# Patient Record
Sex: Male | Born: 1945 | Race: White | Hispanic: No | Marital: Married | Smoking: Former smoker
Health system: Southern US, Community
[De-identification: ages and names within clinical notes are randomized; demographics above are authoritative.]

## PROBLEM LIST (undated history)

## (undated) DIAGNOSIS — I499 Cardiac arrhythmia, unspecified: Secondary | ICD-10-CM

## (undated) DIAGNOSIS — M109 Gout, unspecified: Secondary | ICD-10-CM

## (undated) DIAGNOSIS — E7800 Pure hypercholesterolemia, unspecified: Secondary | ICD-10-CM

## (undated) DIAGNOSIS — I471 Supraventricular tachycardia: Secondary | ICD-10-CM

## (undated) DIAGNOSIS — R7989 Other specified abnormal findings of blood chemistry: Secondary | ICD-10-CM

## (undated) DIAGNOSIS — E039 Hypothyroidism, unspecified: Secondary | ICD-10-CM

## (undated) DIAGNOSIS — I10 Essential (primary) hypertension: Secondary | ICD-10-CM

## (undated) DIAGNOSIS — E079 Disorder of thyroid, unspecified: Secondary | ICD-10-CM

## (undated) DIAGNOSIS — E785 Hyperlipidemia, unspecified: Secondary | ICD-10-CM

## (undated) DIAGNOSIS — R079 Chest pain, unspecified: Secondary | ICD-10-CM

## (undated) DIAGNOSIS — K219 Gastro-esophageal reflux disease without esophagitis: Secondary | ICD-10-CM

## (undated) DIAGNOSIS — C61 Malignant neoplasm of prostate: Secondary | ICD-10-CM

## (undated) HISTORY — PX: APPENDECTOMY: SHX54

## (undated) HISTORY — DX: Gout, unspecified: M10.9

## (undated) HISTORY — PX: PROSTATECTOMY: SHX69

## (undated) HISTORY — DX: Hyperlipidemia, unspecified: E78.5

## (undated) HISTORY — PX: TONSILLECTOMY: SUR1361

## (undated) HISTORY — DX: Hypothyroidism, unspecified: E03.9

## (undated) HISTORY — DX: Chest pain, unspecified: R07.9

## (undated) HISTORY — DX: Supraventricular tachycardia: I47.1

## (undated) HISTORY — DX: Other specified abnormal findings of blood chemistry: R79.89

---

## 2002-05-22 ENCOUNTER — Encounter: Payer: Self-pay | Admitting: Urology

## 2002-05-27 ENCOUNTER — Inpatient Hospital Stay (HOSPITAL_COMMUNITY): Admission: RE | Admit: 2002-05-27 | Discharge: 2002-05-30 | Payer: Self-pay | Admitting: Urology

## 2002-05-27 ENCOUNTER — Encounter (INDEPENDENT_AMBULATORY_CARE_PROVIDER_SITE_OTHER): Payer: Self-pay | Admitting: Specialist

## 2002-08-03 ENCOUNTER — Ambulatory Visit (HOSPITAL_COMMUNITY): Admission: RE | Admit: 2002-08-03 | Discharge: 2002-08-03 | Payer: Self-pay | Admitting: Gastroenterology

## 2003-07-20 ENCOUNTER — Ambulatory Visit (HOSPITAL_COMMUNITY): Admission: RE | Admit: 2003-07-20 | Discharge: 2003-07-20 | Payer: Self-pay | Admitting: Oncology

## 2004-06-19 ENCOUNTER — Ambulatory Visit: Payer: Self-pay | Admitting: Oncology

## 2004-10-19 ENCOUNTER — Ambulatory Visit: Payer: Self-pay | Admitting: Oncology

## 2005-02-15 ENCOUNTER — Ambulatory Visit: Payer: Self-pay | Admitting: Oncology

## 2005-05-17 ENCOUNTER — Ambulatory Visit: Payer: Self-pay | Admitting: Oncology

## 2005-12-12 ENCOUNTER — Ambulatory Visit: Payer: Self-pay | Admitting: Oncology

## 2005-12-14 LAB — CBC WITH DIFFERENTIAL/PLATELET
BASO%: 0.5 % (ref 0.0–2.0)
Basophils Absolute: 0 10*3/uL (ref 0.0–0.1)
EOS%: 1.9 % (ref 0.0–7.0)
Eosinophils Absolute: 0.1 10*3/uL (ref 0.0–0.5)
HCT: 40.2 % (ref 38.7–49.9)
HGB: 14.1 g/dL (ref 13.0–17.1)
LYMPH%: 30.5 % (ref 14.0–48.0)
MCH: 31.6 pg (ref 28.0–33.4)
MCHC: 35.2 g/dL (ref 32.0–35.9)
MCV: 89.9 fL (ref 81.6–98.0)
MONO#: 0.4 10*3/uL (ref 0.1–0.9)
MONO%: 9.6 % (ref 0.0–13.0)
NEUT#: 2.5 10*3/uL (ref 1.5–6.5)
NEUT%: 57.5 % (ref 40.0–75.0)
Platelets: 128 10*3/uL — ABNORMAL LOW (ref 145–400)
RBC: 4.47 10*6/uL (ref 4.20–5.71)
RDW: 12.9 % (ref 11.2–14.6)
WBC: 4.3 10*3/uL (ref 4.0–10.0)
lymph#: 1.3 10*3/uL (ref 0.9–3.3)

## 2006-06-12 ENCOUNTER — Ambulatory Visit: Payer: Self-pay | Admitting: Oncology

## 2006-07-09 ENCOUNTER — Emergency Department (HOSPITAL_COMMUNITY): Admission: EM | Admit: 2006-07-09 | Discharge: 2006-07-09 | Payer: Self-pay | Admitting: Emergency Medicine

## 2007-03-04 ENCOUNTER — Ambulatory Visit: Payer: Self-pay | Admitting: Oncology

## 2007-03-21 LAB — COMPREHENSIVE METABOLIC PANEL
ALT: 28 U/L (ref 0–53)
AST: 22 U/L (ref 0–37)
Alkaline Phosphatase: 62 U/L (ref 39–117)
CO2: 27 mEq/L (ref 19–32)
Sodium: 141 mEq/L (ref 135–145)
Total Bilirubin: 0.8 mg/dL (ref 0.3–1.2)
Total Protein: 6.6 g/dL (ref 6.0–8.3)

## 2007-03-21 LAB — CBC WITH DIFFERENTIAL/PLATELET
BASO%: 0.4 % (ref 0.0–2.0)
EOS%: 2.1 % (ref 0.0–7.0)
LYMPH%: 36.1 % (ref 14.0–48.0)
MCHC: 36 g/dL — ABNORMAL HIGH (ref 32.0–35.9)
MONO#: 0.3 10*3/uL (ref 0.1–0.9)
MONO%: 8.5 % (ref 0.0–13.0)
Platelets: 137 10*3/uL — ABNORMAL LOW (ref 145–400)
RBC: 4.58 10*6/uL (ref 4.20–5.71)
WBC: 2.9 10*3/uL — ABNORMAL LOW (ref 4.0–10.0)

## 2010-06-06 ENCOUNTER — Encounter: Admission: RE | Admit: 2010-06-06 | Discharge: 2010-06-06 | Payer: Self-pay | Admitting: Allergy and Immunology

## 2010-06-06 IMAGING — CR DG CHEST 2V
2 series · 2 of 2 positions shown · non-contrast
Comparison: None.

CLINICAL DATA: Chest and left rib pain.

CHEST - 2 VIEW

[w chest pa]
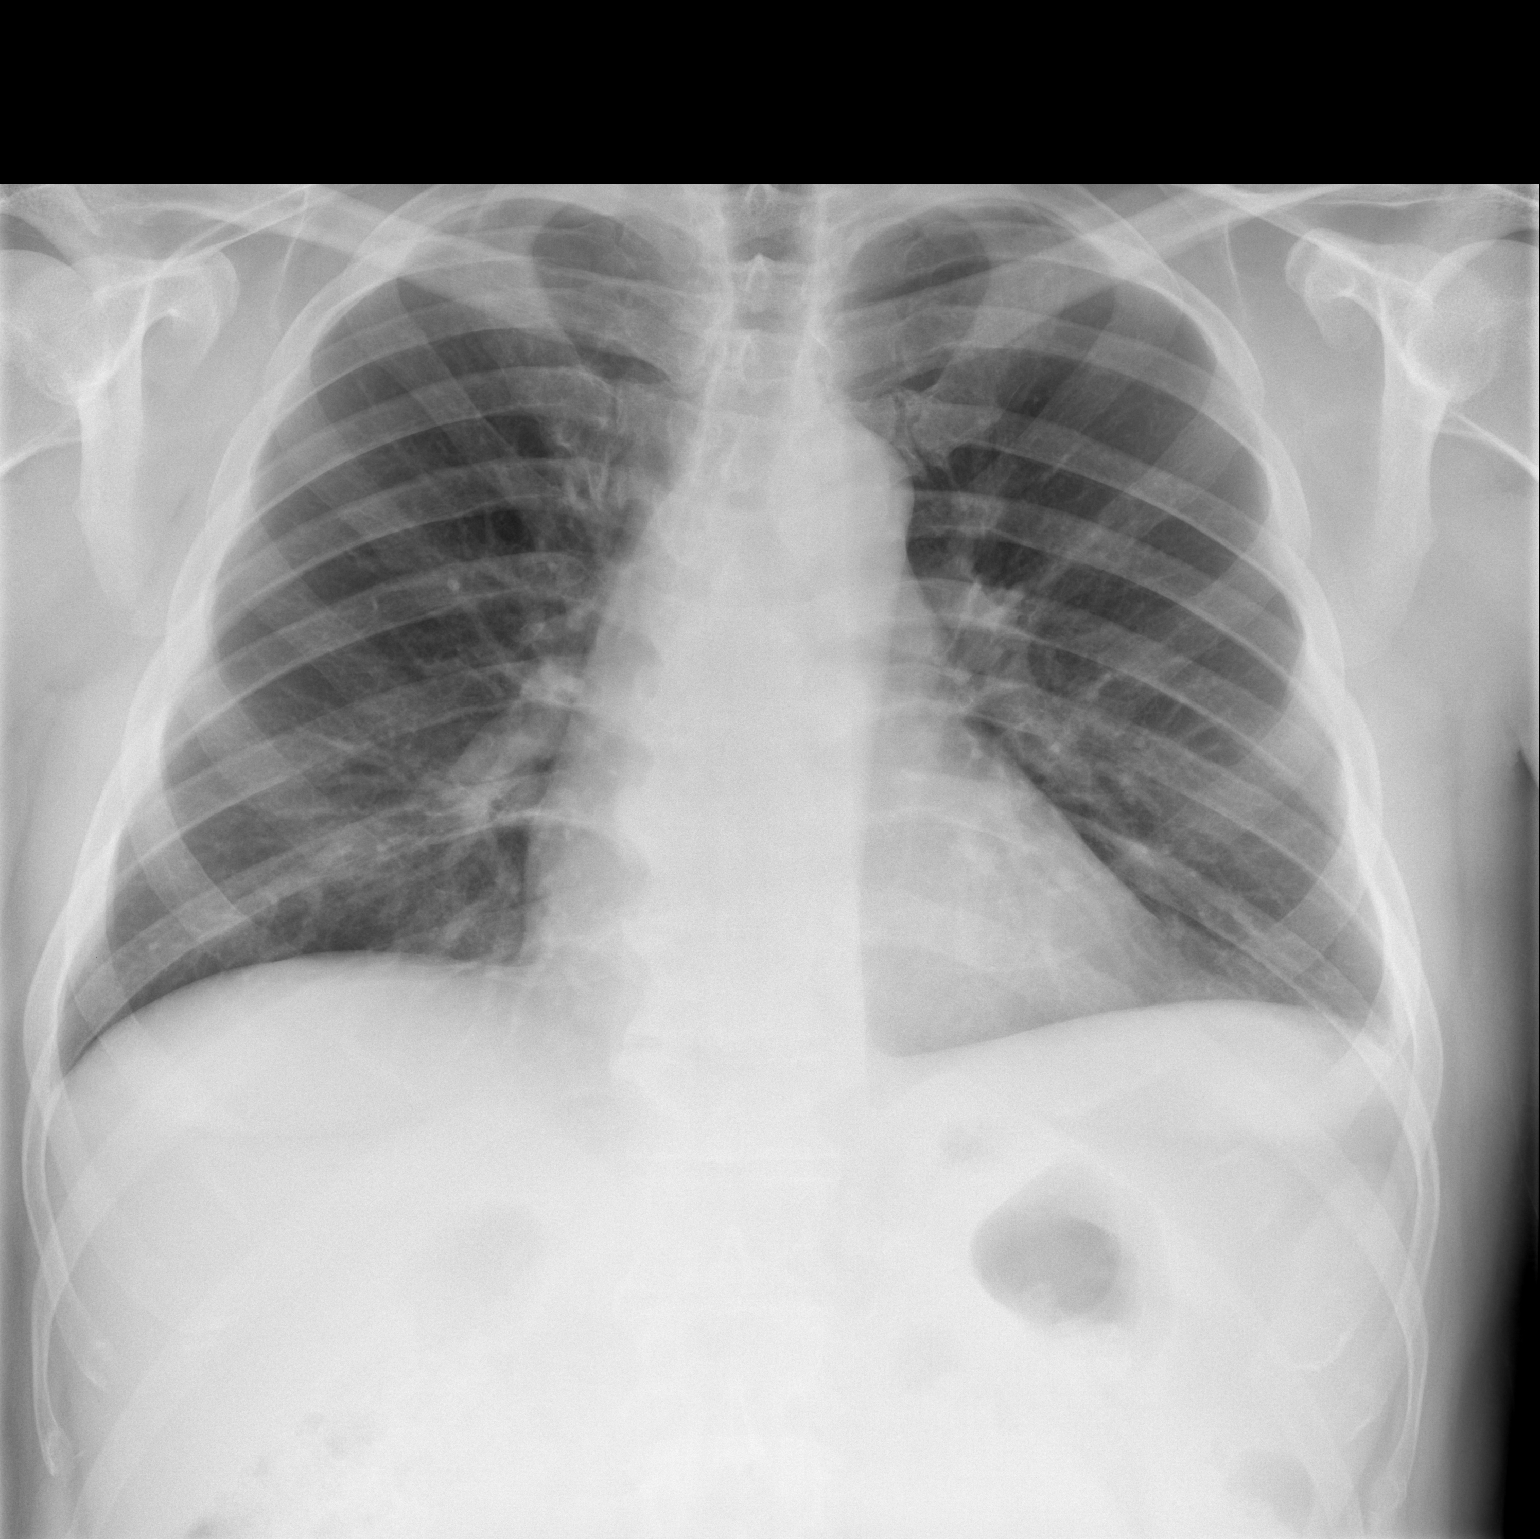

[w chest lat]
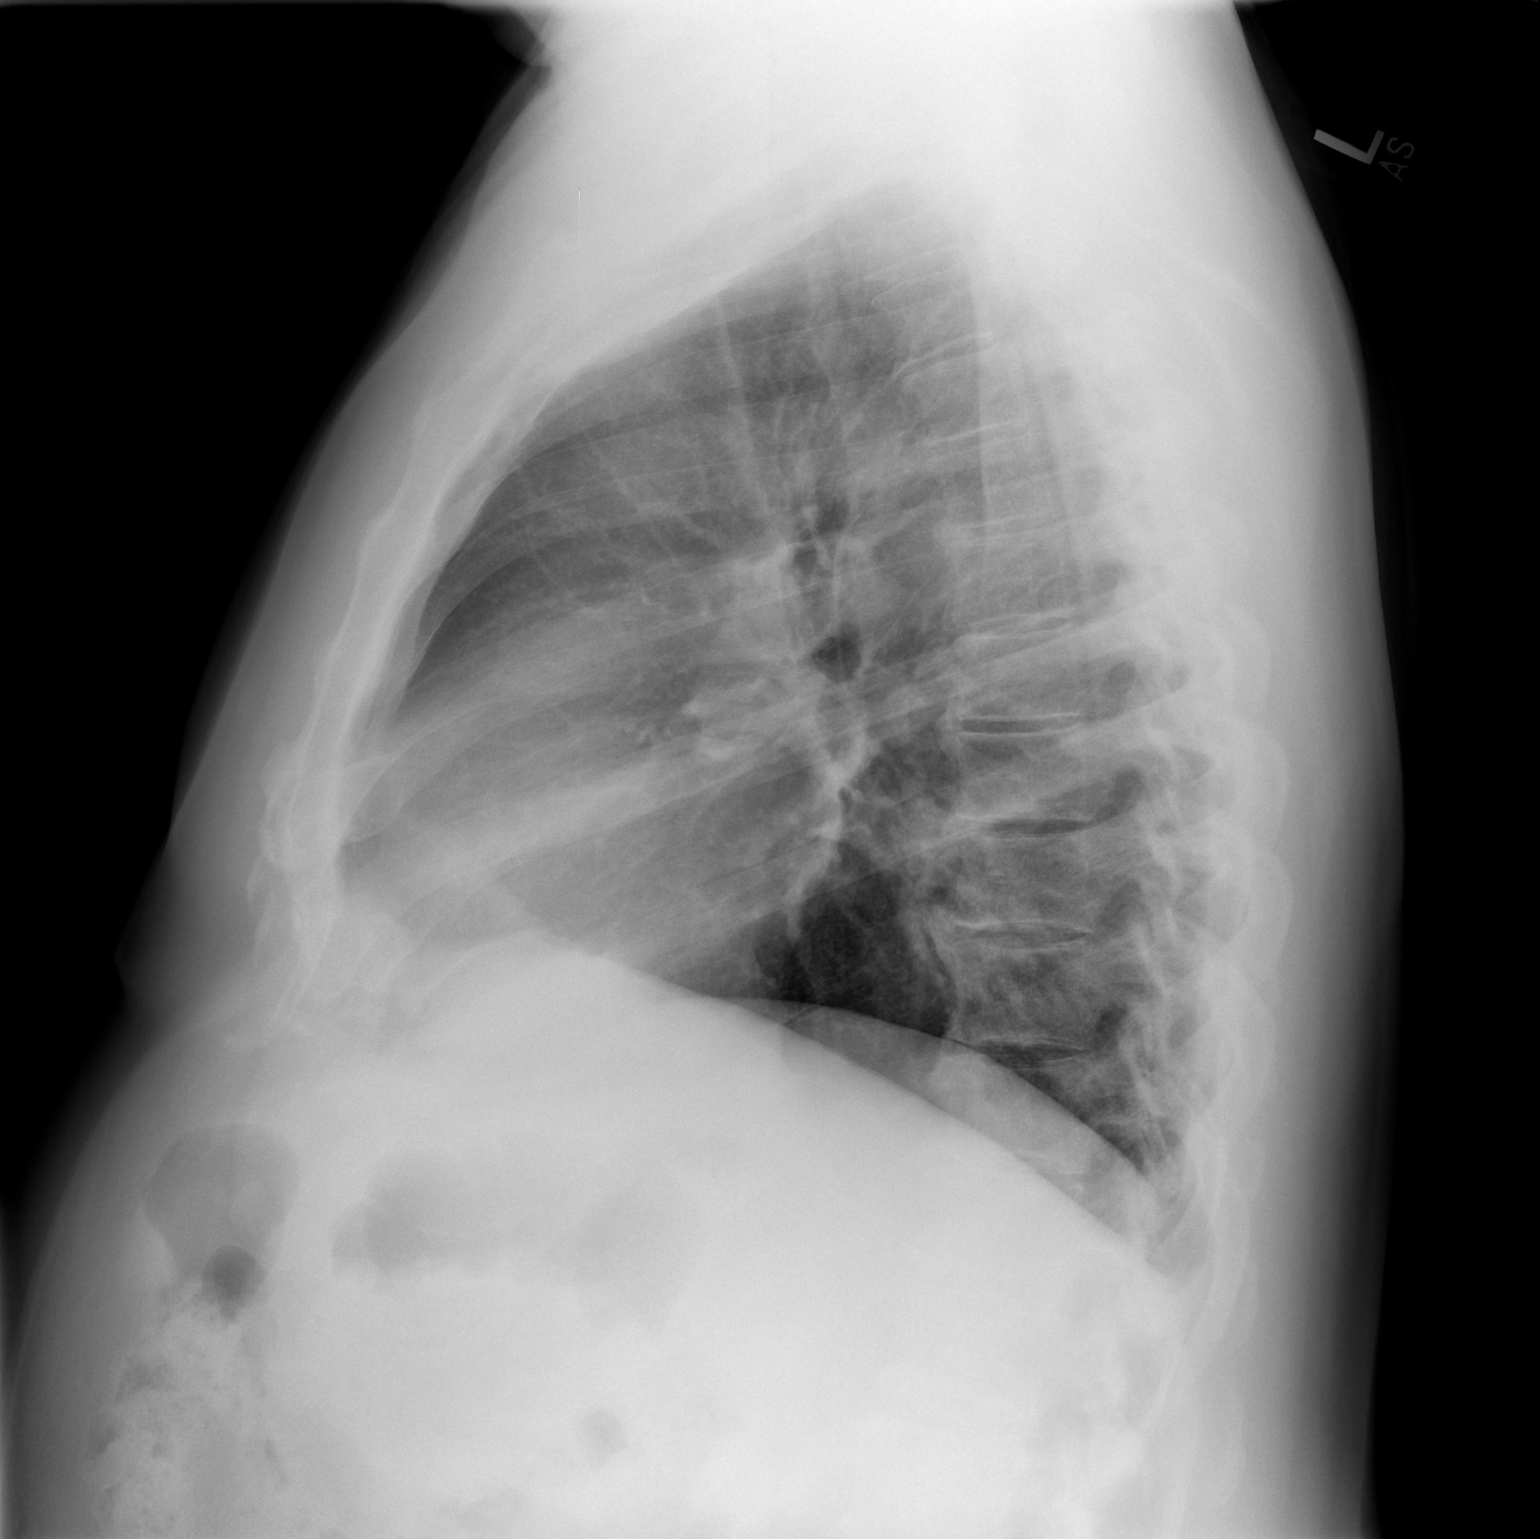

[2 of 2 positions shown; findings below may reference images not displayed]

FINDINGS: The lungs are clear bilaterally.  No confluent airspace
opacities, pleural effuions or pneumothoracies are seen.  The heart
is normal in size and contour.  The upper abdomen and osseous
structures are normal. No displaced rib fractures are seen.
IMPRESSION: No acute cardiopulmonary disease.

## 2010-12-08 NOTE — Op Note (Signed)
NAME:  Jorge Morrison, Jorge Morrison                      ACCOUNT NO.:  [id number]   MEDICAL RECORD NO.:  [id number]                   PATIENT TYPE:  INP   LOCATION:  NA                                   FACILITY:  [facility]   PHYSICIAN:  Maretta Bees. Vonita Moss, M.D.             DATE OF BIRTH:  [birth year]   DATE OF PROCEDURE:  05/27/2002  DATE OF DISCHARGE:                                 OPERATIVE REPORT   PREOPERATIVE DIAGNOSIS:  Carcinoma of the prostate.   POSTOPERATIVE DIAGNOSIS:  Carcinoma of the prostate.   PROCEDURE:  Radical retropubic prostatectomy and bilateral pelvic lymph node  dissection.   SURGEON:  Maretta Bees. Vonita Moss, M.D.   ASSISTANTS:  Lucrezia Starch. Earlene Plater, M.D., and Melvyn Novas, M.D.   ANESTHESIA:  General.   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  He and his wife were counseled about therapeutic options, and he selected  radical prostatectomy.   DESCRIPTION OF PROCEDURE:  The patient was brought to the operating room and  placed in the supine position.  The lower abdomen and external genitalia  were prepped and draped in the usual fashion.  A #4 French 30 cc Foley was  inserted into the bladder without difficulty.  A lower midline vertical  incision was made and the rectus fascia and muscle divided in the midline,  and the retroperitoneal pelvic space was dissected out.  The obturator lymph  node packet on the right side was dissected out without difficulty with care  to preserve the major vessels and the obturator nerve, and hemostasis and  lymphostasis was obtained with Hem-o-loc clips and black silk ties.  The  endopelvic fascia was taken down bilaterally.  He had a lot of large dorsal  veins cascading over the top  of the prostate.  Some back bleeding was  controlled with 2-0 Vicryl suture ligatures.  The puboprostatic ligaments  were taken down, and a Hohenfellner clamp was placed around the dorsal vein  complex and a heavy Vicryl tie placed down, and the dorsal vein complex was  divided, but it was quite thickened and heavy and required some figure-of-  eight sutures of 2-0 Vicryl to control venous bleeding.  The apex of the  prostate was divided with a nice margin at the urethra and the posterior  urethra elevated with an umbilical tape and divided, and the prostate came  up nicely off the rectum.  The prostate was well-seated in the pelvis, and  care was taken to push away the neurovascular bundles.  The prostatic  pedicles were taken down bilaterally with the use of Hem-l-loc clips.  Posterior aspect of seminal vesicles were exposed  and isolated.  The  anterior bladder neck was then divided and indigo carmine-stained urine was  noted to excrete from the ureteral orifices well away from the bladder neck.  The posterior bladder neck was opened and the plane between the prostate and  posterior bladder neck was created, and the seminal vesicles and ampulla of  each vas were divided and clipped with hemoclips.  During the course of this  procedure there was no serious bleeder, but he had a lot of small vessels  that caused blood loss during the course of the procedure, and hemostasis  was performed with a combination of electrocautery, hemoclips, and suture  ligatures of 2-0 chromic catgut.  His vital signs remained stable in view of  an estimated blood loss of 2500 cc.  He was given two units of packed cells  in the course of the procedure.  At this point with bleeding now under good  control, the bladder neck mucosa was approximated to the bladder neck  musculature with 4-0 chromic catgut using interrupted sutures.  The  posterior bladder neck was closed in tennis racquet fashion using running 2-  0  chromic catgut.  This created a nice bladder neck with the mucosa everted.  Vicryl 2-0 sutures on UR5 needles were then used to complete our bladder  neck-urethral anastomosis by placing sutures at 2, 5, 7, 10, and 12 o'clock,  and the bladder was drained with a 22 [place], 5 cc Foley with 15 cc water  placed in the balloon after it was placed in the bladder and after a #1  Prolene suture had been tied through the tip of the Foley catheter and  brought up through the anterior bladder wall into the abdominal wall and  later sewn at skin level using a button to secure it.  Through a stab wound  to the left of the incision, a large Blake drain was placed to drain the  prevesical space.  After tying down the bladder neck anastomosis, the  bladder was irrigated with antibiotic solution and there was no clots or  bleeding of any concern whatsoever.  The wound was irrigated and the fascia  closed with running #1 PDS.  The subcu was irrigated and the skin closed  with skin staples.  The Blake drain was connected to bulb suction after  having been sewn in place with a heavy black silk.  The wound was cleaned,  dressed with dry sterile gauze dressings, the Foley catheter was placed on  traction, and he was taken to the recovery room in good condition, having  tolerated the procedure well.                                               Maretta Bees. Vonita Moss, M.D.    LJP/MEDQ  D:  05/27/2002  T:  05/27/2002  Job:  [id number]   cc:   Samul Dada, M.D.  [address] [facility]  [place]  [place] [address]  Fax: [phone]

## 2010-12-08 NOTE — Discharge Summary (Signed)
   NAME:  Jorge Morrison, Jorge Morrison                      ACCOUNT NO.:  [id number]   MEDICAL RECORD NO.:  [id number]                   PATIENT TYPE:  INP   LOCATION:  0382                                 FACILITY:  [facility]   PHYSICIAN:  Maretta Bees. Vonita Moss, M.D.             DATE OF BIRTH:  08/22/1945   DATE OF ADMISSION:  05/27/2002  DATE OF DISCHARGE:  05/30/2002                                 DISCHARGE SUMMARY   FINAL DIAGNOSES:  1. Prostatic carcinoma.  2. Hypertension.  3. Thrombocytopenia.  4. Gastroesophageal reflux disease.   PROCEDURE:  Radical retropubic prostatectomy and bilateral pelvic lymph node  dissection on May 27, 2002.   HISTORY OF PRESENT ILLNESS:  This 65 year old white male had a PSA that rose  to 5.21 and subsequently 5.69 and had a vague tiny nodule in the lateral  aspect of the right lobe of the prostate, and he had Gleason grade 7 on  biopsy in the right lobe and Gleason grade 6 in the left lobe with some  perineural invasion.  He was counseled preoperatively about therapies and  cleared by Dr. Arline Asp for some thrombocytopenia without any significant  bleeding disorders.   HOSPITAL COURSE:  After admission the patient underwent radical retropubic  prostatectomy and bilateral pelvic lymph node dissection.  He did require  intraoperative transfusion.  His hemoglobin stabilized in the range of 10  postoperatively.  His postoperative course was benign.  He did have some  hypokalemia, requiring some IV calcium.  His postoperative course was  otherwise smooth and benign, and his Harrison Mons drain was removed on the day of  discharge.  He was sent home with a Foley catheter connected to a leg bag.   DISCHARGE MEDICATIONS:  Tylox for pain, in addition to his usual  medications, including Doxazosin, Niaspan, hydrochlorothiazide, Allegra, and  multivitamins.   CONDITION ON DISCHARGE:  He was sent home in good condition.   DIET:  He will resume diet as tolerated.   ACTIVITY:  He will go home on limited activity.   FOLLOW-UP:  He will return to the office in one week in follow-up.   PATHOLOGY:  His final pathology showed no tumor in lymph nodes, but a  Gleason grade 7 carcinoma with tumor to the surgical margin, and no tumor in  the seminal vesicles.                                                Maretta Bees. Vonita Moss, M.D.    LJP/MEDQ  D:  06/08/2002  T:  06/08/2002  Job:  [id number]

## 2010-12-08 NOTE — H&P (Signed)
   NAME:  Jorge Morrison, Jorge Morrison                      ACCOUNT NO.:  [id number]   MEDICAL RECORD NO.:  [id number]                   PATIENT TYPE:  INP   LOCATION:  NA                                   FACILITY:  [facility]   PHYSICIAN:  Maretta Bees. Vonita Moss, M.D.             DATE OF BIRTH:  10-04-1945   DATE OF ADMISSION:  DATE OF DISCHARGE:                                HISTORY & PHYSICAL   HISTORY OF PRESENT ILLNESS:  The patient is a 65 year old white male who was  sent to me because his PSA had risen from 3.5 in 2002 to 5.21 in June of  2003 and then to 5.69 in July of this year. He had a vague tiny nodule in  the lateral aspect of the right lobe and underwent a biopsy of the prostate  that showed Gleason grade 7 in the right lobe and Gleason grade 6 in the  left lobe. There was some perineural invasion noted. He and his wife were  informed about various therapies including observation, radicle  prostatectomy, radiotherapy and of less utilized techniques. He opted for  surgery and is scheduled at this date. He has a history of low platelets  counts that have not been a clinical problem and was cleared for surgery by  Dr. Arline Asp in that regard.   PAST MEDICAL HISTORY:  Includes hypertension and hypercholesterolemia. He  occasionally has some gastroesophageal reflux disease.   PAST SURGICAL HISTORY:  Include an appendectomy and a tonsillectomy and  adenoidectomy. He has also had a valvectomy.   SOCIAL HISTORY:  He does not smoke. He drinks occasional alcohol.   FAMILY HISTORY:  As noted on the health history form.   REVIEW OF SYSTEMS:  As noted on the health history form.   PHYSICAL EXAMINATION:  VITAL SIGNS: Blood pressure is 120/88, pulse 62,  temperature 97.  GENERAL: Alert and oriented. In no acute distress.  SKIN: Warm and dry.  NECK: Supple.  LUNGS:  No respiratory distress.  ABDOMEN: Soft and nontender.  GU: External genitalia normal. Prostate examination unremarkable.   IMPRESSION:  1. Prostatic carcinoma.  2. Hypertension.  3.     Thrombocytopenia.  4. Gastroesophageal reflux disease.   PLAN:  Radical retropubic prostatectomy and bilateral pelvic lymph node  dissection.                                                Maretta Bees. Vonita Moss, M.D.    LJP/MEDQ  D:  05/27/2002  T:  05/27/2002  Job:  [id number]   cc:   Thelma Barge P. Modesto Charon, M.D.   Samul Dada, M.D.  [address] [facility]  [place]  [place] [address]  Fax: [phone]

## 2010-12-08 NOTE — Op Note (Signed)
   NAME:  Jorge Morrison, [name]                      ACCOUNT NO.:  [id number]   MEDICAL RECORD NO.:  [id number]                   PATIENT TYPE:  AMB   LOCATION:  ENDO                                 FACILITY:  [facility]   PHYSICIAN:  Bernette Redbird, M.D.                DATE OF BIRTH:  [birth year]   DATE OF PROCEDURE:  08/03/2002  DATE OF DISCHARGE:                                 OPERATIVE REPORT   PROCEDURE:  Colonoscopy.   ENDOSCOPIST:  Bernette Redbird, M.D.   INDICATION:  Hemoccult-positive stool noted on a rectal at Dr. Thelma Barge P.  Wong's office.  The patient was on aspirin at the time.  There is a family  history of colon polyps.   FINDINGS:  Normal exam to the cecum.   DESCRIPTION OF PROCEDURE:  The nature, purpose and risks of the procedure  had been discussed with the patient, who provided written consent.  The  Olympus adjustable-tension pediatric video colonoscope was advanced to the  cecum, using some external abdominal compression to control looping.  Pullback was then performed.  The quality of the prep was excellent and it  was felt that all areas were well-seen.   This was a normal examination.  No polyps, cancer, colitis, vascular  malformations or diverticulosis were noted.  Retroflexion in the rectum was  unremarkable.  No biopsies were obtained.  The patient tolerated the  procedure well and there were no apparent complications.    IMPRESSION:  Hemoccult-positive stool and presumably due to aspirin  exposure, without any significant upper tract symptoms.   PLAN:  Screening colonoscopy in five years because of a family history of  colon polyps.                                                Bernette Redbird, M.D.    RB/MEDQ  D:  08/03/2002  T:  08/03/2002  Job:  [id number]   cc:   Thelma Barge P. Modesto Charon, M.D.  [address]  [place]  [place] [address]  Fax: [phone]   Maretta Bees. Vonita Moss, M.D.  [address]  [place]  [place] [address]  Fax:  [phone]

## 2013-12-16 ENCOUNTER — Other Ambulatory Visit: Payer: Self-pay | Admitting: Dermatology

## 2014-01-22 ENCOUNTER — Emergency Department (HOSPITAL_BASED_OUTPATIENT_CLINIC_OR_DEPARTMENT_OTHER)
Admission: EM | Admit: 2014-01-22 | Discharge: 2014-01-22 | Disposition: A | Discharge status: Home / Self Care | Payer: 59 | Attending: Emergency Medicine | Admitting: Emergency Medicine

## 2014-01-22 ENCOUNTER — Encounter (HOSPITAL_BASED_OUTPATIENT_CLINIC_OR_DEPARTMENT_OTHER): Payer: Self-pay | Admitting: Emergency Medicine

## 2014-01-22 ENCOUNTER — Emergency Department (HOSPITAL_BASED_OUTPATIENT_CLINIC_OR_DEPARTMENT_OTHER): Payer: 59

## 2014-01-22 DIAGNOSIS — E079 Disorder of thyroid, unspecified: Secondary | ICD-10-CM | POA: Insufficient documentation

## 2014-01-22 DIAGNOSIS — Y929 Unspecified place or not applicable: Secondary | ICD-10-CM | POA: Insufficient documentation

## 2014-01-22 DIAGNOSIS — S81009A Unspecified open wound, unspecified knee, initial encounter: Secondary | ICD-10-CM | POA: Insufficient documentation

## 2014-01-22 DIAGNOSIS — S81809A Unspecified open wound, unspecified lower leg, initial encounter: Principal | ICD-10-CM

## 2014-01-22 DIAGNOSIS — Y9389 Activity, other specified: Secondary | ICD-10-CM | POA: Insufficient documentation

## 2014-01-22 DIAGNOSIS — S91009A Unspecified open wound, unspecified ankle, initial encounter: Principal | ICD-10-CM

## 2014-01-22 DIAGNOSIS — W298XXA Contact with other powered powered hand tools and household machinery, initial encounter: Secondary | ICD-10-CM | POA: Insufficient documentation

## 2014-01-22 DIAGNOSIS — S81012A Laceration without foreign body, left knee, initial encounter: Secondary | ICD-10-CM

## 2014-01-22 DIAGNOSIS — I10 Essential (primary) hypertension: Secondary | ICD-10-CM | POA: Insufficient documentation

## 2014-01-22 HISTORY — DX: Pure hypercholesterolemia, unspecified: E78.00

## 2014-01-22 HISTORY — DX: Disorder of thyroid, unspecified: E07.9

## 2014-01-22 HISTORY — DX: Essential (primary) hypertension: I10

## 2014-01-22 NOTE — ED Provider Notes (Addendum)
CSN: [id number]     Arrival date & time 01/22/14  1256 History   First MD Initiated Contact with Patient 01/22/14 1321     Chief Complaint  Patient presents with  . Extremity Laceration     (Consider location/radiation/quality/duration/timing/severity/associated sxs/prior Treatment) Patient is a 68 y.o. male presenting with skin laceration. The history is provided by the patient.  Laceration Location:  Leg Leg laceration location:  L knee Length (cm):  5cm Depth:  Cutaneous Quality: straight   Bleeding: controlled   Injury mechanism: chainsaw. Pain details:    Quality:  Dull   Severity:  Mild   Timing:  Constant   Progression:  Unchanged Foreign body present:  No foreign bodies Relieved by:  Pressure Tetanus status:  Up to date   Past Medical History  Diagnosis Date  . Hypertension   . Thyroid disease   . High cholesterol    Past Surgical History  Procedure Laterality Date  . Appendectomy    . Prostatectomy     No family history on file. History  Substance Use Topics  . Smoking status: Never Smoker   . Smokeless tobacco: Not on file  . Alcohol Use: No    Review of Systems  All other systems reviewed and are negative.     Allergies  Review of patient's allergies indicates no known allergies.  Home Medications   Prior to Admission medications   Medication Sig Start Date End Date Taking? Authorizing Provider  HYDROCHLOROTHIAZIDE PO Take by mouth.   Yes Historical Provider, MD  Levothyroxine Sodium (SYNTHROID PO) Take by mouth.   Yes Historical Provider, MD  LISINOPRIL PO Take by mouth.   Yes Historical Provider, MD  Nutritional Supplements (STATINS SUPPORT PO) Take by mouth.   Yes Historical Provider, MD   BP 143/79  Pulse 79  Temp(Src) 98.3 F (36.8 C)  Resp 18  Ht 6\' 3"  (1.905 m)  Wt 260 lb (117.935 kg)  BMI 32.50 kg/m2  SpO2 98% Physical Exam  Nursing note and vitals reviewed. Constitutional: He is oriented to person, place, and time. He  appears well-developed and well-nourished. No distress.  HENT:  Head: Normocephalic and atraumatic.  Eyes: EOM are normal. Pupils are equal, round, and reactive to light.  Cardiovascular: Normal rate.   Pulmonary/Chest: Effort normal.  Musculoskeletal:       Left knee: He exhibits laceration. He exhibits normal range of motion, no swelling, no effusion, no ecchymosis and no deformity.       Legs: Neurological: He is alert and oriented to person, place, and time.  Skin: Skin is warm and dry.  Psychiatric: He has a normal mood and affect. His behavior is normal.    ED Course  Procedures (including critical care time) Labs Review Labs Reviewed - No data to display  Imaging Review Dg Knee Complete 4 Views Left  01/22/2014   CLINICAL DATA:  Patient slipped with chain sign cut himself  EXAM: LEFT KNEE - COMPLETE 4+ VIEW  COMPARISON:  None.  FINDINGS: No fracture or dislocation. No joint effusion. Mild patellofemoral osteoarthritis. No radiopaque foreign body or subcutaneous emphysema. Peripheral vascular atherosclerotic disease.  IMPRESSION: No acute osseous abnormality of the left knee.   Electronically Signed   By: Kathreen Devoid   On: 01/22/2014 13:31     EKG Interpretation None     LACERATION REPAIR Performed by: Blanchie Dessert Authorized by: Blanchie Dessert Consent: Verbal consent obtained. Risks and benefits: risks, benefits and alternatives were discussed Consent given by: patient Patient  identity confirmed: provided demographic data Prepped and Draped in normal sterile fashion Wound explored  Laceration Location: Left knee  Laceration Length: 5 cm  No Foreign Bodies seen or palpated  Anesthesia: local infiltration  Local anesthetic: lidocaine 2 % with epinephrine  Anesthetic total: 5 ml  Irrigation method: syringe Amount of cleaning: standard  Skin closure: staples  Number of sutures: 8  Technique: staples  Patient tolerance: Patient tolerated the  procedure well with no immediate complications.   MDM   Final diagnoses:  Knee laceration, left, initial encounter    Patient with a laceration to the knee without underlying injury or joint involvement.  No other injuries.  Wound repair as above.  Tetanus UTD.    Blanchie Dessert, MD 01/22/14 [place], MD 01/22/14 1347

## 2014-01-22 NOTE — ED Notes (Signed)
Laceration to his left knee with a chain saw this am. Bleeding controlled. Ambulatory.

## 2014-01-22 NOTE — ED Notes (Signed)
Bacitracin applied and wound covered with gauze and wrapped with kerlex.

## 2016-01-30 ENCOUNTER — Other Ambulatory Visit: Payer: Self-pay | Admitting: Gastroenterology

## 2016-01-30 DIAGNOSIS — K640 First degree hemorrhoids: Secondary | ICD-10-CM | POA: Diagnosis not present

## 2016-01-30 DIAGNOSIS — K635 Polyp of colon: Secondary | ICD-10-CM | POA: Diagnosis not present

## 2016-01-30 DIAGNOSIS — K633 Ulcer of intestine: Secondary | ICD-10-CM | POA: Diagnosis not present

## 2016-01-30 DIAGNOSIS — D122 Benign neoplasm of ascending colon: Secondary | ICD-10-CM | POA: Diagnosis not present

## 2016-01-30 DIAGNOSIS — Z86010 Personal history of colonic polyps: Secondary | ICD-10-CM | POA: Diagnosis not present

## 2016-01-30 DIAGNOSIS — K5730 Diverticulosis of large intestine without perforation or abscess without bleeding: Secondary | ICD-10-CM | POA: Diagnosis not present

## 2016-02-23 DIAGNOSIS — E039 Hypothyroidism, unspecified: Secondary | ICD-10-CM | POA: Diagnosis not present

## 2016-02-23 DIAGNOSIS — Z1159 Encounter for screening for other viral diseases: Secondary | ICD-10-CM | POA: Diagnosis not present

## 2016-02-23 DIAGNOSIS — I10 Essential (primary) hypertension: Secondary | ICD-10-CM | POA: Diagnosis not present

## 2016-02-23 DIAGNOSIS — Z0000 Encounter for general adult medical examination without abnormal findings: Secondary | ICD-10-CM | POA: Diagnosis not present

## 2016-02-23 DIAGNOSIS — D72819 Decreased white blood cell count, unspecified: Secondary | ICD-10-CM | POA: Diagnosis not present

## 2016-02-23 DIAGNOSIS — E7800 Pure hypercholesterolemia, unspecified: Secondary | ICD-10-CM | POA: Diagnosis not present

## 2016-02-23 DIAGNOSIS — Z6834 Body mass index (BMI) 34.0-34.9, adult: Secondary | ICD-10-CM | POA: Diagnosis not present

## 2016-06-06 DIAGNOSIS — N5201 Erectile dysfunction due to arterial insufficiency: Secondary | ICD-10-CM | POA: Diagnosis not present

## 2016-06-06 DIAGNOSIS — C61 Malignant neoplasm of prostate: Secondary | ICD-10-CM | POA: Diagnosis not present

## 2016-06-26 DIAGNOSIS — Z8371 Family history of colonic polyps: Secondary | ICD-10-CM | POA: Diagnosis not present

## 2016-06-26 DIAGNOSIS — E7800 Pure hypercholesterolemia, unspecified: Secondary | ICD-10-CM | POA: Diagnosis not present

## 2016-06-26 DIAGNOSIS — E039 Hypothyroidism, unspecified: Secondary | ICD-10-CM | POA: Diagnosis not present

## 2016-06-26 DIAGNOSIS — I10 Essential (primary) hypertension: Secondary | ICD-10-CM | POA: Diagnosis not present

## 2016-06-26 DIAGNOSIS — J4520 Mild intermittent asthma, uncomplicated: Secondary | ICD-10-CM | POA: Diagnosis not present

## 2016-07-13 DIAGNOSIS — D2272 Melanocytic nevi of left lower limb, including hip: Secondary | ICD-10-CM | POA: Diagnosis not present

## 2016-07-13 DIAGNOSIS — D2262 Melanocytic nevi of left upper limb, including shoulder: Secondary | ICD-10-CM | POA: Diagnosis not present

## 2016-07-13 DIAGNOSIS — L738 Other specified follicular disorders: Secondary | ICD-10-CM | POA: Diagnosis not present

## 2016-07-13 DIAGNOSIS — L821 Other seborrheic keratosis: Secondary | ICD-10-CM | POA: Diagnosis not present

## 2016-07-13 DIAGNOSIS — D485 Neoplasm of uncertain behavior of skin: Secondary | ICD-10-CM | POA: Diagnosis not present

## 2016-07-13 DIAGNOSIS — L570 Actinic keratosis: Secondary | ICD-10-CM | POA: Diagnosis not present

## 2016-07-13 DIAGNOSIS — Z85828 Personal history of other malignant neoplasm of skin: Secondary | ICD-10-CM | POA: Diagnosis not present

## 2016-08-22 DIAGNOSIS — M79675 Pain in left toe(s): Secondary | ICD-10-CM | POA: Diagnosis not present

## 2016-10-25 DIAGNOSIS — E039 Hypothyroidism, unspecified: Secondary | ICD-10-CM | POA: Diagnosis not present

## 2016-10-25 DIAGNOSIS — E7800 Pure hypercholesterolemia, unspecified: Secondary | ICD-10-CM | POA: Diagnosis not present

## 2016-10-25 DIAGNOSIS — M19079 Primary osteoarthritis, unspecified ankle and foot: Secondary | ICD-10-CM | POA: Diagnosis not present

## 2017-03-15 DIAGNOSIS — H04123 Dry eye syndrome of bilateral lacrimal glands: Secondary | ICD-10-CM | POA: Diagnosis not present

## 2017-03-18 DIAGNOSIS — E039 Hypothyroidism, unspecified: Secondary | ICD-10-CM | POA: Diagnosis not present

## 2017-03-18 DIAGNOSIS — Z0000 Encounter for general adult medical examination without abnormal findings: Secondary | ICD-10-CM | POA: Diagnosis not present

## 2017-03-18 DIAGNOSIS — Z6832 Body mass index (BMI) 32.0-32.9, adult: Secondary | ICD-10-CM | POA: Diagnosis not present

## 2017-03-18 DIAGNOSIS — E7800 Pure hypercholesterolemia, unspecified: Secondary | ICD-10-CM | POA: Diagnosis not present

## 2017-03-18 DIAGNOSIS — D696 Thrombocytopenia, unspecified: Secondary | ICD-10-CM | POA: Diagnosis not present

## 2017-04-05 DIAGNOSIS — H04123 Dry eye syndrome of bilateral lacrimal glands: Secondary | ICD-10-CM | POA: Diagnosis not present

## 2017-05-28 DIAGNOSIS — B356 Tinea cruris: Secondary | ICD-10-CM | POA: Diagnosis not present

## 2017-05-28 DIAGNOSIS — I10 Essential (primary) hypertension: Secondary | ICD-10-CM | POA: Diagnosis not present

## 2017-05-28 DIAGNOSIS — E039 Hypothyroidism, unspecified: Secondary | ICD-10-CM | POA: Diagnosis not present

## 2017-05-29 DIAGNOSIS — S40012A Contusion of left shoulder, initial encounter: Secondary | ICD-10-CM | POA: Diagnosis not present

## 2017-05-29 DIAGNOSIS — M545 Low back pain: Secondary | ICD-10-CM | POA: Diagnosis not present

## 2017-06-07 DIAGNOSIS — N5231 Erectile dysfunction following radical prostatectomy: Secondary | ICD-10-CM | POA: Diagnosis not present

## 2017-06-07 DIAGNOSIS — C61 Malignant neoplasm of prostate: Secondary | ICD-10-CM | POA: Diagnosis not present

## 2017-07-04 DIAGNOSIS — I10 Essential (primary) hypertension: Secondary | ICD-10-CM | POA: Diagnosis not present

## 2017-07-04 DIAGNOSIS — E039 Hypothyroidism, unspecified: Secondary | ICD-10-CM | POA: Diagnosis not present

## 2017-07-04 DIAGNOSIS — E790 Hyperuricemia without signs of inflammatory arthritis and tophaceous disease: Secondary | ICD-10-CM | POA: Diagnosis not present

## 2017-09-20 DIAGNOSIS — E7800 Pure hypercholesterolemia, unspecified: Secondary | ICD-10-CM | POA: Diagnosis not present

## 2017-09-20 DIAGNOSIS — I10 Essential (primary) hypertension: Secondary | ICD-10-CM | POA: Diagnosis not present

## 2017-09-20 DIAGNOSIS — E790 Hyperuricemia without signs of inflammatory arthritis and tophaceous disease: Secondary | ICD-10-CM | POA: Diagnosis not present

## 2017-09-20 DIAGNOSIS — E039 Hypothyroidism, unspecified: Secondary | ICD-10-CM | POA: Diagnosis not present

## 2018-02-03 DIAGNOSIS — I10 Essential (primary) hypertension: Secondary | ICD-10-CM | POA: Diagnosis not present

## 2018-02-03 DIAGNOSIS — E039 Hypothyroidism, unspecified: Secondary | ICD-10-CM | POA: Diagnosis not present

## 2018-02-03 DIAGNOSIS — E790 Hyperuricemia without signs of inflammatory arthritis and tophaceous disease: Secondary | ICD-10-CM | POA: Diagnosis not present

## 2018-02-03 DIAGNOSIS — E7800 Pure hypercholesterolemia, unspecified: Secondary | ICD-10-CM | POA: Diagnosis not present

## 2018-02-20 DIAGNOSIS — J0100 Acute maxillary sinusitis, unspecified: Secondary | ICD-10-CM | POA: Diagnosis not present

## 2018-02-20 DIAGNOSIS — Z91018 Allergy to other foods: Secondary | ICD-10-CM | POA: Diagnosis not present

## 2018-02-20 DIAGNOSIS — J209 Acute bronchitis, unspecified: Secondary | ICD-10-CM | POA: Diagnosis not present

## 2018-04-01 DIAGNOSIS — M10071 Idiopathic gout, right ankle and foot: Secondary | ICD-10-CM | POA: Diagnosis not present

## 2018-04-01 DIAGNOSIS — E039 Hypothyroidism, unspecified: Secondary | ICD-10-CM | POA: Diagnosis not present

## 2018-04-01 DIAGNOSIS — I10 Essential (primary) hypertension: Secondary | ICD-10-CM | POA: Diagnosis not present

## 2018-04-01 DIAGNOSIS — Z23 Encounter for immunization: Secondary | ICD-10-CM | POA: Diagnosis not present

## 2018-05-08 DIAGNOSIS — H6691 Otitis media, unspecified, right ear: Secondary | ICD-10-CM | POA: Diagnosis not present

## 2018-05-20 DIAGNOSIS — Z1283 Encounter for screening for malignant neoplasm of skin: Secondary | ICD-10-CM | POA: Diagnosis not present

## 2018-05-20 DIAGNOSIS — D485 Neoplasm of uncertain behavior of skin: Secondary | ICD-10-CM | POA: Diagnosis not present

## 2018-05-20 DIAGNOSIS — X32XXXA Exposure to sunlight, initial encounter: Secondary | ICD-10-CM | POA: Diagnosis not present

## 2018-05-20 DIAGNOSIS — L570 Actinic keratosis: Secondary | ICD-10-CM | POA: Diagnosis not present

## 2018-05-20 DIAGNOSIS — D0461 Carcinoma in situ of skin of right upper limb, including shoulder: Secondary | ICD-10-CM | POA: Diagnosis not present

## 2018-05-20 DIAGNOSIS — D225 Melanocytic nevi of trunk: Secondary | ICD-10-CM | POA: Diagnosis not present

## 2018-06-09 DIAGNOSIS — R0989 Other specified symptoms and signs involving the circulatory and respiratory systems: Secondary | ICD-10-CM | POA: Diagnosis not present

## 2018-07-01 DIAGNOSIS — H2513 Age-related nuclear cataract, bilateral: Secondary | ICD-10-CM | POA: Diagnosis not present

## 2018-07-11 DIAGNOSIS — E039 Hypothyroidism, unspecified: Secondary | ICD-10-CM | POA: Diagnosis not present

## 2018-07-11 DIAGNOSIS — E790 Hyperuricemia without signs of inflammatory arthritis and tophaceous disease: Secondary | ICD-10-CM | POA: Diagnosis not present

## 2018-07-11 DIAGNOSIS — E7800 Pure hypercholesterolemia, unspecified: Secondary | ICD-10-CM | POA: Diagnosis not present

## 2018-07-11 DIAGNOSIS — I10 Essential (primary) hypertension: Secondary | ICD-10-CM | POA: Diagnosis not present

## 2018-08-01 DIAGNOSIS — C61 Malignant neoplasm of prostate: Secondary | ICD-10-CM | POA: Diagnosis not present

## 2018-08-07 DIAGNOSIS — R69 Illness, unspecified: Secondary | ICD-10-CM | POA: Diagnosis not present

## 2018-08-12 DIAGNOSIS — M1712 Unilateral primary osteoarthritis, left knee: Secondary | ICD-10-CM | POA: Diagnosis not present

## 2018-08-12 DIAGNOSIS — N3941 Urge incontinence: Secondary | ICD-10-CM | POA: Diagnosis not present

## 2018-08-12 DIAGNOSIS — C61 Malignant neoplasm of prostate: Secondary | ICD-10-CM | POA: Diagnosis not present

## 2018-08-12 DIAGNOSIS — M25462 Effusion, left knee: Secondary | ICD-10-CM | POA: Diagnosis not present

## 2018-08-12 DIAGNOSIS — N5231 Erectile dysfunction following radical prostatectomy: Secondary | ICD-10-CM | POA: Diagnosis not present

## 2018-09-09 ENCOUNTER — Encounter (HOSPITAL_BASED_OUTPATIENT_CLINIC_OR_DEPARTMENT_OTHER): Payer: Self-pay

## 2018-09-09 ENCOUNTER — Inpatient Hospital Stay (HOSPITAL_BASED_OUTPATIENT_CLINIC_OR_DEPARTMENT_OTHER)
Admission: EM | Admit: 2018-09-09 | Discharge: 2018-09-11 | DRG: 281 | Disposition: A | Discharge status: Home / Self Care | Payer: Medicare HMO | Attending: Family Medicine | Admitting: Family Medicine

## 2018-09-09 ENCOUNTER — Other Ambulatory Visit: Payer: Self-pay

## 2018-09-09 ENCOUNTER — Emergency Department (HOSPITAL_BASED_OUTPATIENT_CLINIC_OR_DEPARTMENT_OTHER): Payer: Medicare HMO

## 2018-09-09 DIAGNOSIS — E7800 Pure hypercholesterolemia, unspecified: Secondary | ICD-10-CM | POA: Diagnosis present

## 2018-09-09 DIAGNOSIS — Z87891 Personal history of nicotine dependence: Secondary | ICD-10-CM

## 2018-09-09 DIAGNOSIS — K219 Gastro-esophageal reflux disease without esophagitis: Secondary | ICD-10-CM | POA: Diagnosis not present

## 2018-09-09 DIAGNOSIS — R7989 Other specified abnormal findings of blood chemistry: Secondary | ICD-10-CM

## 2018-09-09 DIAGNOSIS — E785 Hyperlipidemia, unspecified: Secondary | ICD-10-CM | POA: Diagnosis present

## 2018-09-09 DIAGNOSIS — R778 Other specified abnormalities of plasma proteins: Secondary | ICD-10-CM

## 2018-09-09 DIAGNOSIS — Z8249 Family history of ischemic heart disease and other diseases of the circulatory system: Secondary | ICD-10-CM

## 2018-09-09 DIAGNOSIS — I959 Hypotension, unspecified: Secondary | ICD-10-CM | POA: Diagnosis not present

## 2018-09-09 DIAGNOSIS — M109 Gout, unspecified: Secondary | ICD-10-CM | POA: Diagnosis present

## 2018-09-09 DIAGNOSIS — E782 Mixed hyperlipidemia: Secondary | ICD-10-CM | POA: Diagnosis not present

## 2018-09-09 DIAGNOSIS — I21A1 Myocardial infarction type 2: Principal | ICD-10-CM | POA: Diagnosis present

## 2018-09-09 DIAGNOSIS — Z79890 Hormone replacement therapy (postmenopausal): Secondary | ICD-10-CM

## 2018-09-09 DIAGNOSIS — I471 Supraventricular tachycardia: Secondary | ICD-10-CM | POA: Diagnosis present

## 2018-09-09 DIAGNOSIS — R1013 Epigastric pain: Secondary | ICD-10-CM | POA: Diagnosis not present

## 2018-09-09 DIAGNOSIS — R079 Chest pain, unspecified: Secondary | ICD-10-CM | POA: Diagnosis not present

## 2018-09-09 DIAGNOSIS — R110 Nausea: Secondary | ICD-10-CM

## 2018-09-09 DIAGNOSIS — I248 Other forms of acute ischemic heart disease: Secondary | ICD-10-CM | POA: Diagnosis not present

## 2018-09-09 DIAGNOSIS — K8020 Calculus of gallbladder without cholecystitis without obstruction: Secondary | ICD-10-CM | POA: Diagnosis not present

## 2018-09-09 DIAGNOSIS — I10 Essential (primary) hypertension: Secondary | ICD-10-CM | POA: Diagnosis not present

## 2018-09-09 DIAGNOSIS — K7689 Other specified diseases of liver: Secondary | ICD-10-CM | POA: Diagnosis not present

## 2018-09-09 DIAGNOSIS — E039 Hypothyroidism, unspecified: Secondary | ICD-10-CM | POA: Diagnosis present

## 2018-09-09 DIAGNOSIS — Z8546 Personal history of malignant neoplasm of prostate: Secondary | ICD-10-CM | POA: Diagnosis not present

## 2018-09-09 HISTORY — DX: Gastro-esophageal reflux disease without esophagitis: K21.9

## 2018-09-09 HISTORY — DX: Malignant neoplasm of prostate: C61

## 2018-09-09 HISTORY — DX: Chest pain, unspecified: R07.9

## 2018-09-09 LAB — TROPONIN I
TROPONIN I: 0.08 ng/mL — AB (ref <0.03)
TROPONIN I: 0.13 ng/mL — AB (ref <0.03)
Troponin I: <0.03 ng/mL (ref <0.03)

## 2018-09-09 LAB — CBC WITH DIFFERENTIAL/PLATELET
Abs Immature Granulocytes: 0.06 10*3/uL (ref 0.00–0.07)
Basophils Absolute: 0 10*3/uL (ref 0.0–0.1)
Basophils Relative: 0 %
Eosinophils Absolute: 0 10*3/uL (ref 0.0–0.5)
Eosinophils Relative: 0 %
HCT: 47.4 % (ref 39.0–52.0)
Hemoglobin: 15.5 g/dL (ref 13.0–17.0)
Immature Granulocytes: 1 %
Lymphocytes Relative: 16 %
Lymphs Abs: 1.5 10*3/uL (ref 0.7–4.0)
MCH: 30.2 pg (ref 26.0–34.0)
MCHC: 32.7 g/dL (ref 30.0–36.0)
MCV: 92.2 fL (ref 80.0–100.0)
MONO ABS: 0.6 10*3/uL (ref 0.1–1.0)
MONOS PCT: 6 %
Neutro Abs: 7.4 10*3/uL (ref 1.7–7.7)
Neutrophils Relative %: 77 %
Platelets: 206 10*3/uL (ref 150–400)
RBC: 5.14 MIL/uL (ref 4.22–5.81)
RDW: 14 % (ref 11.5–15.5)
WBC: 9.6 10*3/uL (ref 4.0–10.5)
nRBC: 0 % (ref 0.0–0.2)

## 2018-09-09 LAB — COMPREHENSIVE METABOLIC PANEL
ALT: 80 U/L — ABNORMAL HIGH (ref 0–44)
AST: 80 U/L — ABNORMAL HIGH (ref 15–41)
Albumin: 4.4 g/dL (ref 3.5–5.0)
Alkaline Phosphatase: 110 U/L (ref 38–126)
Anion gap: 11 (ref 5–15)
BUN: 26 mg/dL — ABNORMAL HIGH (ref 8–23)
CHLORIDE: 102 mmol/L (ref 98–111)
CO2: 27 mmol/L (ref 22–32)
Calcium: 9.3 mg/dL (ref 8.9–10.3)
Creatinine, Ser: 1.28 mg/dL — ABNORMAL HIGH (ref 0.61–1.24)
GFR calc non Af Amer: 56 mL/min — ABNORMAL LOW (ref >60)
Glucose, Bld: 152 mg/dL — ABNORMAL HIGH (ref 70–99)
POTASSIUM: 3.8 mmol/L (ref 3.5–5.1)
Sodium: 140 mmol/L (ref 135–145)
Total Bilirubin: 1.1 mg/dL (ref 0.3–1.2)
Total Protein: 7.1 g/dL (ref 6.5–8.1)

## 2018-09-09 LAB — LIPASE, BLOOD: Lipase: 30 U/L (ref 11–51)

## 2018-09-09 LAB — TSH: TSH: 7.11 u[IU]/mL — ABNORMAL HIGH (ref 0.350–4.500)

## 2018-09-09 MED ORDER — ADENOSINE 6 MG/2ML IV SOLN
6.0000 mg | Freq: Once | INTRAVENOUS | Status: AC
  Administered 2018-09-09: 6 mg via INTRAVENOUS

## 2018-09-09 MED ORDER — HEPARIN (PORCINE) 25000 UT/250ML-% IV SOLN
1100.0000 [IU]/h | INTRAVENOUS | Status: DC
  Administered 2018-09-09: 1200 [IU]/h via INTRAVENOUS
  Filled 2018-09-09: qty 250

## 2018-09-09 MED ORDER — HEPARIN BOLUS VIA INFUSION
4000.0000 [IU] | Freq: Once | INTRAVENOUS | Status: AC
  Administered 2018-09-09: 4000 [IU] via INTRAVENOUS

## 2018-09-09 MED ORDER — ADENOSINE 6 MG/2ML IV SOLN
INTRAVENOUS | Status: AC
  Filled 2018-09-09: qty 6

## 2018-09-09 MED ORDER — SODIUM CHLORIDE 0.9 % IV BOLUS
1000.0000 mL | Freq: Once | INTRAVENOUS | Status: AC
  Administered 2018-09-09: 1000 mL via INTRAVENOUS

## 2018-09-09 NOTE — ED Notes (Signed)
[facility] notified (Taryn) - patient ready for transport 

## 2018-09-09 NOTE — Progress Notes (Signed)
ANTICOAGULATION CONSULT NOTE - Initial Consult  Pharmacy Consult for heparin Indication: chest pain/ACS  Allergies  Allergen Reactions  . Other     BLUE CHEESE    Patient Measurements:   Heparin Dosing Weight: 109.3  Vital Signs: Temp: 97.8 F (36.6 C) (02/18 1604) Temp Source: Oral (02/18 1604) BP: 127/80 (02/18 2230) Pulse Rate: 73 (02/18 2230)  Labs: Recent Labs    09/09/18 1619 09/09/18 1903 09/09/18 2155  HGB 15.5  --   --   HCT 47.4  --   --   PLT 206  --   --   CREATININE 1.28*  --   --   TROPONINI <0.03 0.08* 0.13*    CrCl cannot be calculated (Unknown ideal weight.).   Medical History: Past Medical History:  Diagnosis Date  . GERD (gastroesophageal reflux disease)   . High cholesterol   . Hypertension   . Prostate cancer ([place])   . Thyroid disease     Medications:  See medication history  Assessment: 73 yo man to start heparin for CP.  He was not on anticoagulation PTA.   Goal of Therapy:  Heparin level 0.3-0.7 units/ml Monitor platelets by anticoagulation protocol: Yes   Plan:  Heparin bolus 4000 units and drip at 1200 units/hr Check heparin level 6-8 hours after start Daily HL and CBC wile on heparin Monitor for bleeding complications  Jorge Morrison 09/09/2018,11:18 PM

## 2018-09-09 NOTE — ED Triage Notes (Addendum)
C/o epigastric pain started 12pm-states took antacid with no relief-NAD- to triage in w/c-stood for weight without difficulty-NAD

## 2018-09-09 NOTE — ED Provider Notes (Signed)
[facility] EMERGENCY DEPARTMENT Provider Note   CSN: [id number] Arrival date & time: 09/09/18  1553    History   Chief Complaint Chief Complaint  Patient presents with  . Chest Pain    HPI Jorge Morrison is a 73 y.o. male history of hypertension, hypothyroidism here presenting with epigastric pain, palpitations.  Patient had acute onset of epigastric pain around 12 PM today.  Patient took some antacid with no relief.  She also noticed that he had some palpitations as well.  He took his heart rate at home and goes from 170s to 190s.  Patient denies any calf pain or recent travel or history of blood clots.  Patient states that he has several family members with a history of coronary artery disease but has no personal history of CAD.      The history is provided by the patient.    Past Medical History:  Diagnosis Date  . GERD (gastroesophageal reflux disease)   . High cholesterol   . Hypertension   . Prostate cancer ([place])   . Thyroid disease     Patient Active Problem List   Diagnosis Date Noted  . Chest pain 09/09/2018    Past Surgical History:  Procedure Laterality Date  . APPENDECTOMY    . PROSTATECTOMY          Home Medications    Prior to Admission medications   Medication Sig Start Date End Date Taking? Authorizing Provider  albuterol (PROVENTIL HFA) 108 (90 BASE) MCG/ACT inhaler Inhale 2 puffs into the lungs every 6 (six) hours as needed for wheezing or shortness of breath.    [provider]  alprostadil (EDEX) 10 MCG injection 10 mcg by Intracavitary route as needed for erectile dysfunction. use no more than 3 times per week    [provider]  atorvastatin (LIPITOR) 40 MG tablet Take 40 mg by mouth daily.    [provider]  EPINEPHrine (EPIPEN 2-PAK) 0.3 mg/0.3 mL IJ SOAJ injection Inject 0.3 mg into the muscle once.    [provider]  fexofenadine (ALLEGRA) 180 MG tablet Take 180 mg by mouth daily.     [provider]  fluticasone (VERAMYST) 27.5 MCG/SPRAY nasal spray Place 1 spray into the nose 2 (two) times daily.    [provider]  HYDROCHLOROTHIAZIDE PO Take by mouth.    [provider]  Levothyroxine Sodium (SYNTHROID PO) Take by mouth.    [provider]  LISINOPRIL PO Take by mouth.    [provider]  mometasone (ASMANEX 120 METERED DOSES) 220 MCG/INH inhaler Inhale 1 puff into the lungs daily.    [provider]  Nutritional Supplements (STATINS SUPPORT PO) Take by mouth.    [provider]  OMEGA-3 KRILL OIL PO Take by mouth daily.    [provider]  omeprazole (PRILOSEC) 20 MG capsule Take 20 mg by mouth daily.    [provider]    Family History No family history on file.  Social History Social History   Tobacco Use  . Smoking status: Former Research scientist (life sciences)  . Smokeless tobacco: Never Used  Substance Use Topics  . Alcohol use: Yes    Comment: daily  . Drug use: No     Allergies   Other   Review of Systems Review of Systems  Cardiovascular: Positive for chest pain and palpitations.  All other systems reviewed and are negative.    Physical Exam Updated Vital Signs BP 118/79  Pulse 72   Temp 97.8 F (36.6 C) (Oral)   Resp 17   Ht 6\' 3"  (1.905 m)   Wt 117.9 kg   SpO2 95%   BMI 32.50 kg/m   Physical Exam Vitals signs and nursing note reviewed.  HENT:     Head: Normocephalic.  Eyes:     Extraocular Movements: Extraocular movements intact.     Pupils: Pupils are equal, round, and reactive to light.  Neck:     Musculoskeletal: Normal range of motion.  Cardiovascular:     Heart sounds: Normal heart sounds.     Comments: Tachycardic  Pulmonary:     Effort: Pulmonary effort is normal.  Abdominal:     General: Bowel sounds are normal.     Palpations: Abdomen is soft.  Musculoskeletal: Normal range of motion.     Right lower leg: He exhibits no tenderness. No edema.      Left lower leg: He exhibits no tenderness. No edema.  Skin:    General: Skin is warm.     Capillary Refill: Capillary refill takes less than 2 seconds.  Neurological:     General: No focal deficit present.     Mental Status: He is alert and oriented to person, place, and time.  Psychiatric:        Mood and Affect: Mood normal.        Behavior: Behavior normal.      ED Treatments / Results  Labs (all labs ordered are listed, but only abnormal results are displayed) Labs Reviewed  COMPREHENSIVE METABOLIC PANEL - Abnormal; Notable for the following components:      Result Value   Glucose, Bld 152 (*)    BUN 26 (*)    Creatinine, Ser 1.28 (*)    AST 80 (*)    ALT 80 (*)    GFR calc non Af Amer 56 (*)    All other components within normal limits  TSH - Abnormal; Notable for the following components:   TSH 7.110 (*)    All other components within normal limits  TROPONIN I - Abnormal; Notable for the following components:   Troponin I 0.08 (*)    All other components within normal limits  TROPONIN I - Abnormal; Notable for the following components:   Troponin I 0.13 (*)    All other components within normal limits  CBC WITH DIFFERENTIAL/PLATELET  TROPONIN I  LIPASE, BLOOD    EKG EKG Interpretation  Date/Time:  Tuesday September 09 2018 16:17:41 EST Ventricular Rate:  97 PR Interval:    QRS Duration: 102 QT Interval:  327 QTC Calculation: 416 R Axis:   42 Text Interpretation:  Sinus rhythm Minimal ST depression, diffuse leads previous tracing showed SVT earlier in the day  Confirmed by Wandra Arthurs ([id number]) on 09/09/2018 4:20:35 PM   Radiology Dg Chest Port 1 View  Result Date: 09/09/2018 CLINICAL DATA:  Epigastric abdominal pain. EXAM: PORTABLE CHEST 1 VIEW COMPARISON:  Radiographs of June 09, 2018. FINDINGS: The heart size and mediastinal contours are within normal limits. Both lungs are clear. The visualized skeletal structures are unremarkable. IMPRESSION: No  active disease. Electronically Signed   By: Marijo Conception, M.D.   On: 09/09/2018 16:52    Procedures Procedures (including critical care time)  CRITICAL CARE Performed by: Wandra Arthurs   Total critical care time:30 minutes  Critical care time was exclusive of separately billable procedures and treating other patients.  Critical care was necessary to treat  or prevent imminent or life-threatening deterioration.  Critical care was time spent personally by me on the following activities: development of treatment plan with patient and/or surrogate as well as nursing, discussions with consultants, evaluation of patient's response to treatment, examination of patient, obtaining history from patient or surrogate, ordering and performing treatments and interventions, ordering and review of laboratory studies, ordering and review of radiographic studies, pulse oximetry and re-evaluation of patient's condition.   Medications Ordered in ED Medications  heparin ADULT infusion 100 units/mL (25000 units/265mL sodium chloride 0.45%) (1,200 Units/hr Intravenous New Bag/Given 09/09/18 2324)  adenosine (ADENOCARD) 6 MG/2ML injection 6 mg (0 mg Intravenous Hold 09/09/18 1705)  sodium chloride 0.9 % bolus 1,000 mL (0 mLs Intravenous Stopped 09/09/18 1654)  heparin bolus via infusion 4,000 Units (4,000 Units Intravenous Bolus from Bag 09/09/18 2325)     Initial Impression / Assessment and Plan / ED Course  I have reviewed the triage vital signs and the nursing notes.  Pertinent labs & imaging results that were available during my care of the patient were reviewed by me and considered in my medical decision making (see chart for details).       Jorge Morrison is a 73 y.o. male here with palpitations, epigastric pain. Patient found in SVT with rate of 190s in triage. This is likely causing his symptoms. He has no hx of afib. When I slowed down his rhythm with adenosine, there is underlying sinus rhythm  and his HR went down to 90s. Will get labs, trop x 2, CXR. No PE risk factors   8 pm Initial trop was normal. Second troponin is 0.08. No chest pain currently. I think likely demand ischemia from SVT. His HR is down to 70s now. Will get third troponin and if downtrending, can discharge patient.   11:28 PM Third troponin now up to 0.13. He certainly can have troponin leak from SVT earlier in the day but he has been observed in the ED for 6 hrs already and troponin is still rising. I called Dr. Radford Pax from cardiology. She recommend IV heparin, hospitalist admission and cardiology to consult. He is high risk for ACS as well. Dr. Hal Hope to admit and will transfer to [facility].    Final Clinical Impressions(s) / ED Diagnoses   Final diagnoses:  Elevated troponin  SVT (supraventricular tachycardia) [facility])    ED Discharge Orders    None       Drenda Freeze, MD 09/09/18 2328

## 2018-09-10 ENCOUNTER — Observation Stay (HOSPITAL_COMMUNITY): Payer: Medicare HMO

## 2018-09-10 ENCOUNTER — Observation Stay (HOSPITAL_BASED_OUTPATIENT_CLINIC_OR_DEPARTMENT_OTHER): Payer: Medicare HMO

## 2018-09-10 ENCOUNTER — Encounter (HOSPITAL_COMMUNITY): Payer: Self-pay | Admitting: Internal Medicine

## 2018-09-10 DIAGNOSIS — E785 Hyperlipidemia, unspecified: Secondary | ICD-10-CM | POA: Diagnosis present

## 2018-09-10 DIAGNOSIS — I471 Supraventricular tachycardia, unspecified: Secondary | ICD-10-CM

## 2018-09-10 DIAGNOSIS — R079 Chest pain, unspecified: Secondary | ICD-10-CM | POA: Diagnosis not present

## 2018-09-10 DIAGNOSIS — Z87891 Personal history of nicotine dependence: Secondary | ICD-10-CM | POA: Diagnosis not present

## 2018-09-10 DIAGNOSIS — I10 Essential (primary) hypertension: Secondary | ICD-10-CM | POA: Diagnosis not present

## 2018-09-10 DIAGNOSIS — E7800 Pure hypercholesterolemia, unspecified: Secondary | ICD-10-CM | POA: Diagnosis not present

## 2018-09-10 DIAGNOSIS — E039 Hypothyroidism, unspecified: Secondary | ICD-10-CM | POA: Diagnosis present

## 2018-09-10 DIAGNOSIS — Z8249 Family history of ischemic heart disease and other diseases of the circulatory system: Secondary | ICD-10-CM | POA: Diagnosis not present

## 2018-09-10 DIAGNOSIS — K219 Gastro-esophageal reflux disease without esophagitis: Secondary | ICD-10-CM | POA: Diagnosis not present

## 2018-09-10 DIAGNOSIS — Z8546 Personal history of malignant neoplasm of prostate: Secondary | ICD-10-CM | POA: Diagnosis not present

## 2018-09-10 DIAGNOSIS — K8020 Calculus of gallbladder without cholecystitis without obstruction: Secondary | ICD-10-CM | POA: Diagnosis not present

## 2018-09-10 DIAGNOSIS — R7989 Other specified abnormal findings of blood chemistry: Secondary | ICD-10-CM

## 2018-09-10 DIAGNOSIS — M109 Gout, unspecified: Secondary | ICD-10-CM | POA: Diagnosis present

## 2018-09-10 DIAGNOSIS — I21A1 Myocardial infarction type 2: Secondary | ICD-10-CM | POA: Diagnosis not present

## 2018-09-10 DIAGNOSIS — I959 Hypotension, unspecified: Secondary | ICD-10-CM | POA: Diagnosis not present

## 2018-09-10 DIAGNOSIS — K7689 Other specified diseases of liver: Secondary | ICD-10-CM | POA: Diagnosis not present

## 2018-09-10 DIAGNOSIS — R778 Other specified abnormalities of plasma proteins: Secondary | ICD-10-CM

## 2018-09-10 HISTORY — DX: Hypothyroidism, unspecified: E03.9

## 2018-09-10 HISTORY — DX: Supraventricular tachycardia: I47.1

## 2018-09-10 HISTORY — DX: Hyperlipidemia, unspecified: E78.5

## 2018-09-10 HISTORY — DX: Other specified abnormalities of plasma proteins: R77.8

## 2018-09-10 HISTORY — DX: Other specified abnormal findings of blood chemistry: R79.89

## 2018-09-10 HISTORY — DX: Gout, unspecified: M10.9

## 2018-09-10 HISTORY — DX: Supraventricular tachycardia, unspecified: I47.10

## 2018-09-10 LAB — CBC WITH DIFFERENTIAL/PLATELET
ABS IMMATURE GRANULOCYTES: 0.01 10*3/uL (ref 0.00–0.07)
Basophils Absolute: 0 10*3/uL (ref 0.0–0.1)
Basophils Relative: 0 %
Eosinophils Absolute: 0 10*3/uL (ref 0.0–0.5)
Eosinophils Relative: 1 %
HCT: 38 % — ABNORMAL LOW (ref 39.0–52.0)
Hemoglobin: 13 g/dL (ref 13.0–17.0)
Immature Granulocytes: 0 %
Lymphocytes Relative: 38 %
Lymphs Abs: 1.8 10*3/uL (ref 0.7–4.0)
MCH: 30.9 pg (ref 26.0–34.0)
MCHC: 34.2 g/dL (ref 30.0–36.0)
MCV: 90.3 fL (ref 80.0–100.0)
Monocytes Absolute: 0.4 10*3/uL (ref 0.1–1.0)
Monocytes Relative: 9 %
NEUTROS ABS: 2.5 10*3/uL (ref 1.7–7.7)
Neutrophils Relative %: 52 %
Platelets: 131 10*3/uL — ABNORMAL LOW (ref 150–400)
RBC: 4.21 MIL/uL — ABNORMAL LOW (ref 4.22–5.81)
RDW: 14.1 % (ref 11.5–15.5)
WBC: 4.8 10*3/uL (ref 4.0–10.5)
nRBC: 0 % (ref 0.0–0.2)

## 2018-09-10 LAB — BASIC METABOLIC PANEL
Anion gap: 6 (ref 5–15)
BUN: 14 mg/dL (ref 8–23)
CO2: 30 mmol/L (ref 22–32)
Calcium: 8.9 mg/dL (ref 8.9–10.3)
Chloride: 105 mmol/L (ref 98–111)
Creatinine, Ser: 1.01 mg/dL (ref 0.61–1.24)
GFR calc Af Amer: >60 mL/min (ref >60)
GFR calc non Af Amer: >60 mL/min (ref >60)
Glucose, Bld: 95 mg/dL (ref 70–99)
Potassium: 3.6 mmol/L (ref 3.5–5.1)
Sodium: 141 mmol/L (ref 135–145)

## 2018-09-10 LAB — HEPATIC FUNCTION PANEL
ALT: 56 U/L — ABNORMAL HIGH (ref 0–44)
AST: 35 U/L (ref 15–41)
Albumin: 3.4 g/dL — ABNORMAL LOW (ref 3.5–5.0)
Alkaline Phosphatase: 72 U/L (ref 38–126)
Bilirubin, Direct: 0.1 mg/dL (ref 0.0–0.2)
Indirect Bilirubin: 0.8 mg/dL (ref 0.3–0.9)
Total Bilirubin: 0.9 mg/dL (ref 0.3–1.2)
Total Protein: 5.7 g/dL — ABNORMAL LOW (ref 6.5–8.1)

## 2018-09-10 LAB — TROPONIN I
Troponin I: 0.16 ng/mL (ref <0.03)
Troponin I: 0.12 ng/mL (ref <0.03)

## 2018-09-10 LAB — ECHOCARDIOGRAM COMPLETE
Height: 75 in
Weight: 4216 oz

## 2018-09-10 LAB — HIV ANTIBODY (ROUTINE TESTING W REFLEX): HIV Screen 4th Generation wRfx: NONREACTIVE

## 2018-09-10 LAB — HEPARIN LEVEL (UNFRACTIONATED): Heparin Unfractionated: 0.76 IU/mL — ABNORMAL HIGH (ref 0.30–0.70)

## 2018-09-10 MED ORDER — ONDANSETRON HCL 4 MG PO TABS: 4.0000 mg | ORAL_TABLET | Freq: Four times a day (QID) | ORAL | Status: DC | PRN

## 2018-09-10 MED ORDER — ACETAMINOPHEN 650 MG RE SUPP: 650.0000 mg | Freq: Four times a day (QID) | RECTAL | Status: DC | PRN

## 2018-09-10 MED ORDER — ATORVASTATIN CALCIUM 40 MG PO TABS
40.0000 mg | ORAL_TABLET | Freq: Every day | ORAL | Status: DC
  Administered 2018-09-10: 40 mg via ORAL
  Filled 2018-09-10: qty 1

## 2018-09-10 MED ORDER — ALLOPURINOL 300 MG PO TABS
300.0000 mg | ORAL_TABLET | Freq: Every day | ORAL | Status: DC
  Administered 2018-09-10: 300 mg via ORAL
  Filled 2018-09-10: qty 1

## 2018-09-10 MED ORDER — LEVOTHYROXINE SODIUM 75 MCG PO TABS
150.0000 ug | ORAL_TABLET | Freq: Every day | ORAL | Status: DC
  Administered 2018-09-10 – 2018-09-11 (×2): 150 ug via ORAL
  Filled 2018-09-10 (×2): qty 2

## 2018-09-10 MED ORDER — ACETAMINOPHEN 325 MG PO TABS: 650.0000 mg | ORAL_TABLET | Freq: Four times a day (QID) | ORAL | Status: DC | PRN

## 2018-09-10 MED ORDER — SODIUM CHLORIDE 0.9 % IV SOLN
INTRAVENOUS | Status: DC
  Administered 2018-09-10: 06:00:00 via INTRAVENOUS

## 2018-09-10 MED ORDER — DILTIAZEM HCL ER COATED BEADS 120 MG PO CP24
120.0000 mg | ORAL_CAPSULE | Freq: Every day | ORAL | Status: DC
  Administered 2018-09-10 – 2018-09-11 (×2): 120 mg via ORAL
  Filled 2018-09-10 (×2): qty 1

## 2018-09-10 MED ORDER — ONDANSETRON HCL 4 MG/2ML IJ SOLN: 4.0000 mg | Freq: Four times a day (QID) | INTRAMUSCULAR | Status: DC | PRN

## 2018-09-10 NOTE — Progress Notes (Signed)
ANTICOAGULATION CONSULT NOTE - [place] for heparin Indication: chest pain/ACS  Allergies  Allergen Reactions  . Other     BLUE CHEESE    Patient Measurements: Height: 6\' 3"  (190.5 cm) Weight: 263 lb 8 oz (119.5 kg) IBW/kg (Calculated) : 84.5 Heparin Dosing Weight: 109.3  Vital Signs: Temp: 98.4 F (36.9 C) (02/19 0737) Temp Source: Oral (02/19 0737) BP: 116/73 (02/19 0737) Pulse Rate: 63 (02/19 0737)  Labs: Recent Labs    09/09/18 1619 09/09/18 1903 09/09/18 2155 09/10/18 0500 09/10/18 0641  HGB 15.5  --   --  13.0  --   HCT 47.4  --   --  38.0*  --   PLT 206  --   --  131*  --   HEPARINUNFRC  --   --   --   --  0.76*  CREATININE 1.28*  --   --  1.01  --   TROPONINI <0.03 0.08* 0.13* 0.16*  --     Estimated Creatinine Clearance: 94.8 mL/min (by C-G formula based on SCr of 1.01 mg/dL).   Medical History: Past Medical History:  Diagnosis Date  . GERD (gastroesophageal reflux disease)   . High cholesterol   . Hypertension   . Prostate cancer ([place])   . Thyroid disease      Assessment: 23 yoM presented with SOB found to be in SVT with elevated troponins. Pharmacy to dose heparin for ACS r/o. No OAC at home, pltc down slightly, H/H stable. Heparin level above goal at 0.76, no S/Sx bleeding per RN noted.   Goal of Therapy:  Heparin level 0.3-0.7 units/ml Monitor platelets by anticoagulation protocol: Yes   Plan:  -Reduce heparin to 1100 units/h -Recheck heparin level in 8 hr  Arrie Senate, PharmD, BCPS Clinical Pharmacist [phone] Please check AMION for all [facility] numbers 09/10/2018

## 2018-09-10 NOTE — H&P (Signed)
History and Physical    Jorge Morrison DVV:[id number] DOB: September 26, 1945 DOA: 09/09/2018  PCP: [name], MD  Patient coming from: Home.  Chief Complaint: Shortness of breath and upper back pain.  HPI: Jorge Morrison is a 72 y.o. male with history of hypertension, hyperlipidemia, gout, hypothyroidism has been having nonspecific discomfort upper shoulder over the last 2 weeks which acutely worsened yesterday morning around 1130.  Patient also had some nausea along with it with shortness of breath.  Patient checked his blood pressure was found to be hypotensive and was found to be tachycardic and decided to come to the ER.  ED Course: In the ER patient had a heart rate of around 190 bpm was found to be in SVT with hypotension.  Was given adenosine following which patient's rhythm converted to sinus.  LFTs are mildly elevated.  Patient had some off-and-on chest discomfort.  Troponin tends to increase and on-call cardiology was consulted.  Since patient has family history of CAD in his brother and father patient was started on heparin and admitted for further observation.  Review of Systems: As per HPI, rest all negative.   Past Medical History:  Diagnosis Date  . GERD (gastroesophageal reflux disease)   . High cholesterol   . Hypertension   . Prostate cancer ([place])   . Thyroid disease     Past Surgical History:  Procedure Laterality Date  . APPENDECTOMY    . PROSTATECTOMY       reports that he has quit smoking. He has never used smokeless tobacco. He reports current alcohol use. He reports that he does not use drugs.  Allergies  Allergen Reactions  . Other     BLUE CHEESE    Family History  Problem Relation Age of Onset  . CAD Mother   . CAD Brother     Prior to Admission medications   Medication Sig Start Date End Date Taking? Authorizing Provider  albuterol (PROVENTIL HFA) 108 (90 BASE) MCG/ACT inhaler Inhale 2 puffs into the lungs every 6 (six) hours as  needed for wheezing or shortness of breath.    [provider]  alprostadil (EDEX) 10 MCG injection 10 mcg by Intracavitary route as needed for erectile dysfunction. use no more than 3 times per week    [provider]  atorvastatin (LIPITOR) 40 MG tablet Take 40 mg by mouth daily.    [provider]  EPINEPHrine (EPIPEN 2-PAK) 0.3 mg/0.3 mL IJ SOAJ injection Inject 0.3 mg into the muscle once.    [provider]  fexofenadine (ALLEGRA) 180 MG tablet Take 180 mg by mouth daily.    [provider]  fluticasone (VERAMYST) 27.5 MCG/SPRAY nasal spray Place 1 spray into the nose 2 (two) times daily.    [provider]  HYDROCHLOROTHIAZIDE PO Take by mouth.    [provider]  Levothyroxine Sodium (SYNTHROID PO) Take by mouth.    [provider]  LISINOPRIL PO Take by mouth.    [provider]  mometasone (ASMANEX 120 METERED DOSES) 220 MCG/INH inhaler Inhale 1 puff into the lungs daily.    [provider]  Nutritional Supplements (STATINS SUPPORT PO) Take by mouth.    [provider]  OMEGA-3 KRILL OIL PO Take by mouth daily.    [provider]  omeprazole (PRILOSEC) 20 MG capsule Take 20 mg by mouth daily.    [provider]    Physical Exam: Vitals:   09/09/18 2230 09/09/18  2300 09/09/18 2330 09/10/18 0110  BP: 127/80 118/79 133/89 130/79  Pulse: 73 72 78 70  Resp: 18 17 18 20   Temp:    98 F (36.7 C)  TempSrc:    Oral  SpO2: 98% 95% 97% 98%  Weight:  117.9 kg  119.5 kg  Height:  6\' 3"  (1.905 m)  6\' 3"  (1.905 m)      Constitutional: Moderately built and nourished. Vitals:   09/09/18 2230 09/09/18 2300 09/09/18 2330 09/10/18 0110  BP: 127/80 118/79 133/89 130/79  Pulse: 73 72 78 70  Resp: 18 17 18 20   Temp:    98 F (36.7 C)  TempSrc:    Oral  SpO2: 98% 95% 97% 98%  Weight:  117.9 kg  119.5 kg  Height:  6\' 3"  (1.905 m)  6\' 3"  (1.905 m)   Eyes: Anicteric no  pallor. ENMT: No discharge from the ears eyes nose and mouth. Neck: No mass felt.  No neck rigidity. Respiratory: No rhonchi or crepitations. Cardiovascular: S1-S2 heard. Abdomen: Soft nontender bowel sounds present. Musculoskeletal: No edema.  Effusion. Skin: No rash. Neurologic: Alert awake oriented to time place and person.  Moves all extremities. Psychiatric: Appears normal.  Normal affect.   Labs on Admission: I have personally reviewed following labs and imaging studies  CBC: Recent Labs  Lab 09/09/18 1619  WBC 9.6  NEUTROABS 7.4  HGB 15.5  HCT 47.4  MCV 92.2  PLT 010   Basic Metabolic Panel: Recent Labs  Lab 09/09/18 1619  NA 140  K 3.8  CL 102  CO2 27  GLUCOSE 152*  BUN 26*  CREATININE 1.28*  CALCIUM 9.3   GFR: Estimated Creatinine Clearance: 74.8 mL/min (A) (by C-G formula based on SCr of 1.28 mg/dL (H)). Liver Function Tests: Recent Labs  Lab 09/09/18 1619  AST 80*  ALT 80*  ALKPHOS 110  BILITOT 1.1  PROT 7.1  ALBUMIN 4.4   Recent Labs  Lab 09/09/18 1619  LIPASE 30   No results for input(s): AMMONIA in the last 168 hours. Coagulation Profile: No results for input(s): INR, PROTIME in the last 168 hours. Cardiac Enzymes: Recent Labs  Lab 09/09/18 1619 09/09/18 1903 09/09/18 2155  TROPONINI <0.03 0.08* 0.13*   BNP (last 3 results) No results for input(s): PROBNP in the last 8760 hours. HbA1C: No results for input(s): HGBA1C in the last 72 hours. CBG: No results for input(s): GLUCAP in the last 168 hours. Lipid Profile: No results for input(s): CHOL, HDL, LDLCALC, TRIG, CHOLHDL, LDLDIRECT in the last 72 hours. Thyroid Function Tests: Recent Labs    09/09/18 1619  TSH 7.110*   Anemia Panel: No results for input(s): VITAMINB12, FOLATE, FERRITIN, TIBC, IRON, RETICCTPCT in the last 72 hours. Urine analysis: No results found for: COLORURINE, APPEARANCEUR, LABSPEC, PHURINE, GLUCOSEU, HGBUR, BILIRUBINUR, KETONESUR, PROTEINUR,  UROBILINOGEN, NITRITE, LEUKOCYTESUR Sepsis Labs: @LABRCNTIP (procalcitonin:4,lacticidven:4) )No results found for this or any previous visit (from the past 240 hour(s)).   Radiological Exams on Admission: Dg Chest Port 1 View  Result Date: 09/09/2018 CLINICAL DATA:  Epigastric abdominal pain. EXAM: PORTABLE CHEST 1 VIEW COMPARISON:  Radiographs of June 09, 2018. FINDINGS: The heart size and mediastinal contours are within normal limits. Both lungs are clear. The visualized skeletal structures are unremarkable. IMPRESSION: No active disease. Electronically Signed   By: Marijo Conception, M.D.   On: 09/09/2018 16:52    EKG: Independently reviewed.  Initial EKG showed SVT.  Second 1 shows normal sinus rhythm.  Assessment/Plan  Principal Problem:   Chest pain Active Problems:   SVT (supraventricular tachycardia) (H[facility])   Elevated troponin   HLD (hyperlipidemia)   Gout   Hypothyroidism    1. Elevated troponin with chest discomfort -could be related to patient's tachycardia.  However since patient has strong family history of coronary artery disease in father and brothers patient was started on heparin infusion.  We will cycle cardiac markers.  Cardiology has been consulted. 2. Hypertension holding antihypertensives due to patient presenting with hypotension.  Closely follow blood pressure trends.  Restart when patient is stable. 3. Hypothyroidism on Synthroid. 4. Hyperlipidemia on statins. 5. Nausea with mildly elevated LFTs.  Will check sonogram of the abdomen.  Follow LFTs.  If LFTs shows increasing pain may have to hold statins. 6. History of gout on allopurinol.  Be on colchicine.   DVT prophylaxis: Heparin. Code Status: Full code. Family Communication: Discussed with patient. Disposition Plan: Home. Consults called: Cardiology. Admission status: Observation.   Rise Patience MD [facility] Pager [phone].  If 7PM-7AM, please contact  night-coverage [email or web address] Password [facility]  09/10/2018, 4:48 AM

## 2018-09-10 NOTE — Plan of Care (Signed)
  Problem: Activity: Goal: Risk for activity intolerance will decrease Outcome: Completed/Met   Problem: Nutrition: Goal: Adequate nutrition will be maintained Outcome: Completed/Met   Problem: Coping: Goal: Level of anxiety will decrease Outcome: Completed/Met   Problem: Safety: Goal: Ability to remain free from injury will improve Outcome: Completed/Met   Problem: Skin Integrity: Goal: Risk for impaired skin integrity will decrease Outcome: Completed/Met

## 2018-09-10 NOTE — Progress Notes (Signed)
Patient admitted after midnight agree with overall assessment and plan except for as noted below.  On exam he appears to be in no acute distress and is brushing his teeth currently on heparin drip with regular rate and rhythm.  States that he has been having nausea with what sounds like indigestion, headaches, and neck pain.    SVT: Symptoms resolved after receiving 6 mg of adenosine.  [facility] cardiology consultative services who are recommending stopping amlodipine and starting diltiazem to be titrated up. -Continue diltiazem -30-day event monitor and outpatient setting  NSTEMI: Troponin <0.03->0.08->0.13-> 0.16->0.12.  Suspected secondary to type II demand ischemia with SVT, but given risk factors cardiology recommending further evaluation.  Heparin drip was discontinued. - Myocardial perfusion study in a.m.   Hypothyroidism: Appears uncontrolled.  TSH elevated at 7.11, but patient shows TSH was within normal limits and 03/2018 at 3, and at that time had been increased in his dose of levothyroxine to 150 mcg.  Patient reports compliance with medication. - Discussed with patient regarding possible causes of decreased absorption.  - Continue current dose of levothyroxine recommend rechecking in outpatient setting  Renal insufficiency: Improvement of kidney function creatinine from 1.28-> 1.01.  -Continue to monitor  Essential hypertension -Restart losartan tomorrow -Restart hydrochlorothiazide when medically appropriate.     Echocardiogram 09/11/2018: Impression 1. The left ventricle has normal systolic function with an ejection fraction of 60-65%. The cavity size was normal. There is moderately increased left ventricular wall thickness. Left ventricular diastolic parameters were normal.  2. The right ventricle has normal systolic function. The cavity was normal. There is no increase in right ventricular wall thickness.  3. The mitral valve is normal in structure. Mild thickening of the  mitral valve leaflet. Mild calcification of the mitral valve leaflet. There is mild mitral annular calcification present.  4. The tricuspid valve is normal in structure.  5. The aortic valve is tricuspid Mild thickening of the aortic valve Mild calcification of the aortic valve.  6. The pulmonic valve was normal in structure.  7. There is mild dilatation of the aortic root measuring 42 mm.

## 2018-09-10 NOTE — Consult Note (Addendum)
The patient has been seen in conjunction with Ina Homes MD. All aspects of care have been considered and discussed. The patient has been personally interviewed, examined, and all clinical data has been reviewed.   Probably 2 distinct clinical cardiac issues.  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  Plan to discontinue amlodipine and start diltiazem hoping to suppress recurring episodes.  We will start a low-dose but a more likely therapeutic dose will be diltiazem CD 240 to 360 mg/day.  If diltiazem fails to control recurrent SVT, may need to consider ablation.  The second cardiac problem is angina associated with tachycardia and enzyme elevation.  Current presentation is not that of acute coronary syndrome and we do not feel that heparin anticoagulation is necessary.  The enzyme elevation could simply be related to supply demand mismatch in the setting of nonobstructive coronary disease.  However at the patient's age, and with other risk factors, high risk ischemic heart disease needs to be excluded with a stress myocardial perfusion study which should be performed in a.m.  Patient will need a 30-day monitor as an outpatient to ensure that arrhythmias being suppressed.  We suspect that the "indigestion" that is been occurring spontaneously with no rhyme or reason likely represent self terminating episodes of PSVT.  Plan discharge tomorrow after myocardial perfusion imaging unless other data arise that cause further inpatient management to be necessary.  Aggressive secondary risk modification will be needed: Blood pressure 130/80 mmHg or less, LDL less than 70, hemoglobin A1c less than 7, consideration of screening for sleep apnea, moderate aerobic activity greater than 150 minutes/week.   CARDIOLOGY CONSULT NOTE    Patient ID: Jorge Morrison; [id number]; 04-25-1946   Admit date: 09/09/2018 Date of Consult: 09/10/2018  Primary Care Provider: [name], MD Primary Cardiologist:None Primary Electrophysiologist:  None   Patient Profile:   Jorge Morrison is a 73 y.o. 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  History of Present Illness:   Mr. Tashiro states that he is in his usual state of health until around noon on 2/18 when he developed epigastric discomfort. He states that around 11 o'clock he had a protein shake and a couple snack bars. He then proceeded to go outside to help move some trees. Once he got outside he began to feel an epigastric pressure that was consistent with prior reflux pain. He tried to wait it out however it did not resolve so he proceeded to go into the house and take a couple antacids and drink a ginger ale. The pain persisted and he then began to feel short of breath with pressure radiation to his back and neck. He checked his blood pressure and it was in the 140/90s with a heart rate in the 160s. He again tried to wait it out however his discomfort persisted he then checked his vitals again and noted that his blood pressure was lower at 115/80 and his pulse was higher at approximately 190. He subsequently called his wife and was brought to the emergency department for further evaluation.  At baseline he is a very active 73 year old, stating that he typically walks a couple miles a day. With his walks he does not feel short of breath or have chest pressure. He does do cardiovascular targeted exercising and targets a pulse rate of 130 to 140. During this time  he does not have any associated symptoms. He does have a significant family history for heart disease however none of these occurred at an early age, <31 in males and < 61 in females. He is a former smoker but quit over 30 years ago. He is an occasional alcohol user. He does not use any illicit substances. 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 He has never had a prior stress test your heart catheterization. He is never been told that his heart goes into an abnormal rhythm. He does tell me that he had rheumatic fever when he was a child.  He denies recent fevers, chills, headaches, congestion, rhinorrhea, cough, abdominal pain, nausea/vomiting, diarrhea, myalgias, arthralgias.  Past Medical History:  Diagnosis Date  . GERD (gastroesophageal reflux disease)   . High cholesterol   . Hypertension   . Prostate cancer ([place])   . Thyroid disease    Past Surgical History:  Procedure Laterality Date  . APPENDECTOMY    . PROSTATECTOMY      Home Medications:  Prior to Admission medications   Medication Sig Start Date End Date Taking? Authorizing Provider  albuterol (PROVENTIL HFA) 108 (90 BASE) MCG/ACT inhaler Inhale 2 puffs into the lungs every 6 (six) hours as needed for wheezing or shortness of breath.    [provider]  alprostadil (EDEX) 10 MCG injection 10 mcg by Intracavitary route as needed for erectile dysfunction. use no more than 3 times per week    [provider]  amLODipine (NORVASC) 5 MG tablet Take 5 mg by mouth daily. 09/02/18   [provider]  atorvastatin (LIPITOR) 40 MG tablet Take 40 mg by mouth daily.    [provider]  EPINEPHrine (EPIPEN 2-PAK) 0.3 mg/0.3 mL IJ SOAJ injection Inject 0.3 mg into the muscle once.    [provider]  fexofenadine (ALLEGRA) 180 MG tablet Take 180 mg by mouth daily.    [provider]  fluticasone (VERAMYST) 27.5 MCG/SPRAY nasal spray Place 1 spray into the nose 2 (two) times daily.    [provider]  HYDROCHLOROTHIAZIDE PO Take by mouth.    [provider]  Levothyroxine Sodium (SYNTHROID PO) Take by mouth.    [provider]  LISINOPRIL PO Take by mouth.    [provider]  mometasone (ASMANEX 120 METERED DOSES) 220 MCG/INH inhaler Inhale 1 puff into the lungs  daily.    [provider]  montelukast (SINGULAIR) 10 MG tablet Take 10 mg by mouth at bedtime. 09/02/18   [provider]  Nutritional Supplements (STATINS SUPPORT PO) Take by mouth.    [provider]  OMEGA-3 KRILL OIL PO Take by mouth daily.    [provider]  omeprazole (PRILOSEC) 20 MG capsule Take 20 mg by mouth daily.    [provider]   Inpatient Medications: Scheduled Meds: . allopurinol  300 mg Oral QHS  . atorvastatin  40 mg Oral q1800  . levothyroxine  150 mcg Oral Q0600   Continuous Infusions: . sodium chloride 75 mL/hr at 09/10/18 0611  . heparin 1,100 Units/hr (09/10/18 0945)   PRN Meds: acetaminophen **OR** acetaminophen, ondansetron **OR** ondansetron (ZOFRAN) IV  Allergies:    Allergies  Allergen Reactions  . Other     BLUE CHEESE   Social History:   Social History   Socioeconomic History  . Marital status: Married    Spouse name: Not on file  . Number of children: Not on file  .  Years of education: Not on file  . Highest education level: Not on file  Occupational History  . Not on file  Social Needs  . Financial resource strain: Not on file  . Food insecurity:    Worry: Not on file    Inability: Not on file  . Transportation needs:    Medical: Not on file    Non-medical: Not on file  Tobacco Use  . Smoking status: Former Research scientist (life sciences)  . Smokeless tobacco: Never Used  Substance and Sexual Activity  . Alcohol use: Yes    Comment: daily  . Drug use: No  . Sexual activity: Not on file  Lifestyle  . Physical activity:    Days per week: Not on file    Minutes per session: Not on file  . Stress: Not on file  Relationships  . Social connections:    Talks on phone: Not on file    Gets together: Not on file    Attends religious service: Not on file    Active member of club or organization: Not on file    Attends meetings of clubs or organizations: Not on file    Relationship status: Not on file  .  Intimate partner violence:    Fear of current or ex partner: Not on file    Emotionally abused: Not on file    Physically abused: Not on file    Forced sexual activity: Not on file  Other Topics Concern  . Not on file  Social History Narrative  . Not on file    Family History:   Family History  Problem Relation Age of Onset  . CAD Mother   . CAD Brother     ROS:  Performed and all others negative.   Physical Exam/Data:   Vitals:   09/09/18 2330 09/10/18 0110 09/10/18 0520 09/10/18 0737  BP: 133/89 130/79 118/71 116/73  Pulse: 78 70 61 63  Resp: 18 20 20 18   Temp:  98 F (36.7 C) 98 F (36.7 C) 98.4 F (36.9 C)  TempSrc:  Oral Oral Oral  SpO2: 97% 98% 95% 97%  Weight:  119.5 kg    Height:  6\' 3"  (1.905 m)      Intake/Output Summary (Last 24 hours) at 09/10/2018 1321 Last data filed at 09/10/2018 1022 Gross per 24 hour  Intake 82.55 ml  Output 1675 ml  Net -1592.45 ml   Filed Weights   09/09/18 2300 09/10/18 0110  Weight: 117.9 kg 119.5 kg   Body mass index is 32.94 kg/m.   General:  Obese male, in no acute distress HEENT: Normal Lymph: No adenopathy Neck: No JVD Endocrine:  No thryomegaly Vascular: No carotid bruits; FA pulses 2+ bilaterally without bruits  Cardiac:  Normal S1, S2; RRR; no murmur  Lungs:  Clear to auscultation bilaterally, no wheezing, rhonchi or rales  Abd: Soft, nontender, no hepatomegaly  Ext: No edema Musculoskeletal:  No deformities, BUE and BLE strength normal and equal Skin: Warm and dry  Neuro:  CNs 2-12 intact, no focal abnormalities noted Psych:  Normal affect   EKG:  The EKG was personally reviewed and demonstrates: Initial EKG with SVT. Repeat in sinus rhythm without ST changes or new conduction abnormalities   Telemetry:  Telemetry was personally reviewed and demonstrates: NSR with occasional PVC  Relevant CV Studies:  None  Laboratory Data:  Chemistry Recent Labs  Lab 09/09/18 1619 09/10/18 0500  NA 140 141    K 3.8 3.6  CL 102  105  CO2 27 30  GLUCOSE 152* 95  BUN 26* 14  CREATININE 1.28* 1.01  CALCIUM 9.3 8.9  GFRNONAA 56* >60  GFRAA >60 >60  ANIONGAP 11 6    Recent Labs  Lab 09/09/18 1619 09/10/18 0500  PROT 7.1 5.7*  ALBUMIN 4.4 3.4*  AST 80* 35  ALT 80* 56*  ALKPHOS 110 72  BILITOT 1.1 0.9   Hematology Recent Labs  Lab 09/09/18 1619 09/10/18 0500  WBC 9.6 4.8  RBC 5.14 4.21*  HGB 15.5 13.0  HCT 47.4 38.0*  MCV 92.2 90.3  MCH 30.2 30.9  MCHC 32.7 34.2  RDW 14.0 14.1  PLT 206 131*   Cardiac Enzymes Recent Labs  Lab 09/09/18 1619 09/09/18 1903 09/09/18 2155 09/10/18 0500 09/10/18 1051  TROPONINI <0.03 0.08* 0.13* 0.16* 0.12*   No results for input(s): TROPIPOC in the last 168 hours.  BNPNo results for input(s): BNP, PROBNP in the last 168 hours.  DDimer No results for input(s): DDIMER in the last 168 hours.  Radiology/Studies:  US Abdomen Complete  Result Date: 09/10/2018 CLINICAL DATA:  The nausea for 1 week. History of hypertension and prostate cancer. EXAM: ABDOMEN ULTRASOUND COMPLETE COMPARISON:  None. FINDINGS: Gallbladder: Gallbladder wall is 1.9 millimeters, within normal limits. No sonographic Murphy's sign. A 1.1 centimeter stone is identified within the gallbladder. Common bile duct: Diameter: 5.4 millimeters Liver: Small cyst in the RIGHT hepatic lobe is 0.8 x 0.7 x 0.7 centimeters no suspicious liver lesion. Portal vein is patent on color Doppler imaging with normal direction of blood flow towards the liver. IVC: No abnormality visualized. Pancreas: Visualized portion unremarkable. Spleen: Size and appearance within normal limits. Right Kidney: Length: 14.1 centimeters. Renal parenchyma is thin. No suspicious renal mass or hydronephrosis. Left Kidney: Length: 14.4 centimeters. Renal parenchyma is thin. No suspicious renal mass or hydronephrosis. Abdominal aorta: No aneurysm visualized. Other findings: None. IMPRESSION: 1. Cholelithiasis without ultrasound  evidence for acute cholecystitis. 2. Small liver cyst. 3. Mild renal parenchymal thinning bilaterally. Electronically Signed   By: Nolon Nations M.D.   On: 09/10/2018 10:07   Dg Chest Port 1 View  Result Date: 09/09/2018 CLINICAL DATA:  Epigastric abdominal pain. EXAM: PORTABLE CHEST 1 VIEW COMPARISON:  Radiographs of June 09, 2018. FINDINGS: The heart size and mediastinal contours are within normal limits. Both lungs are clear. The visualized skeletal structures are unremarkable. IMPRESSION: No active disease. Electronically Signed   By: Marijo Conception, M.D.   On: 09/09/2018 16:52   Assessment and Plan:   KAIYDEN SIMKIN is a 73 y.o. 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  AVRNT Demand Ischemia  - Patient presented with shortness of breath, chest pressure, and elevated troponin in the setting of SVT, likely AVNRT. - Do not suspect ACS. Can stop heparin  - Patient is low to moderate risk for CAD so can pursue outpatient exercise stress testing  - Would recommend 30-day monitor as outpatient. This will help [place] to stratify the patient to see if he needs ablation. Will go ahead and start a CCB  HLD - Continue Atorvastatin   Hypertension  - Continue losartan and HCTZ - Discontinue amlodipine  - Start CCB  Will discuss the case further with Dr. Tamala Julian.  For questions or updates, please contact [place] Please consult [email or web address] for contact info under Cardiology/STEMI.   Signed, Ina Homes, MD  09/10/2018 1:21 PM

## 2018-09-10 NOTE — Plan of Care (Signed)

## 2018-09-10 NOTE — Care Management Obs Status (Signed)
[place] NOTIFICATION   Patient Details  Name: ABDULRAHMAN BRACEY MRN: [id number] Date of Birth: [birth year]   Medicare Observation Status Notification Given:  Yes    Carles Collet, RN 09/10/2018, 11:09 AM

## 2018-09-11 ENCOUNTER — Other Ambulatory Visit: Payer: Self-pay | Admitting: Cardiology

## 2018-09-11 ENCOUNTER — Inpatient Hospital Stay (HOSPITAL_COMMUNITY): Payer: Medicare HMO

## 2018-09-11 DIAGNOSIS — E782 Mixed hyperlipidemia: Secondary | ICD-10-CM

## 2018-09-11 DIAGNOSIS — I248 Other forms of acute ischemic heart disease: Secondary | ICD-10-CM

## 2018-09-11 DIAGNOSIS — R002 Palpitations: Secondary | ICD-10-CM

## 2018-09-11 LAB — NM MYOCAR MULTI W/SPECT W/WALL MOTION / EF
Estimated workload: 7 METS
Exercise duration (min): 6 min
Exercise duration (sec): 17 s
MPHR: 150 {beats}/min
Peak HR: 133 {beats}/min
Percent HR: 88 %
Rest HR: 75 {beats}/min

## 2018-09-11 LAB — CBC
HCT: 37.8 % — ABNORMAL LOW (ref 39.0–52.0)
HEMOGLOBIN: 12.5 g/dL — AB (ref 13.0–17.0)
MCH: 30.3 pg (ref 26.0–34.0)
MCHC: 33.1 g/dL (ref 30.0–36.0)
MCV: 91.5 fL (ref 80.0–100.0)
Platelets: 125 10*3/uL — ABNORMAL LOW (ref 150–400)
RBC: 4.13 MIL/uL — ABNORMAL LOW (ref 4.22–5.81)
RDW: 13.7 % (ref 11.5–15.5)
WBC: 3.5 10*3/uL — ABNORMAL LOW (ref 4.0–10.5)
nRBC: 0 % (ref 0.0–0.2)

## 2018-09-11 MED ORDER — TECHNETIUM TC 99M TETROFOSMIN IV KIT
30.0000 | PACK | Freq: Once | INTRAVENOUS | Status: AC | PRN
  Administered 2018-09-11: 30 via INTRAVENOUS

## 2018-09-11 MED ORDER — LOSARTAN POTASSIUM 50 MG PO TABS
100.0000 mg | ORAL_TABLET | Freq: Every day | ORAL | Status: DC
  Administered 2018-09-11: 100 mg via ORAL
  Filled 2018-09-11: qty 2

## 2018-09-11 MED ORDER — ENOXAPARIN SODIUM 40 MG/0.4ML ~~LOC~~ SOLN
40.0000 mg | SUBCUTANEOUS | Status: DC
  Administered 2018-09-11: 40 mg via SUBCUTANEOUS
  Filled 2018-09-11: qty 0.4

## 2018-09-11 MED ORDER — TECHNETIUM TC 99M TETROFOSMIN IV KIT
10.0000 | PACK | Freq: Once | INTRAVENOUS | Status: AC | PRN
  Administered 2018-09-11: 10 via INTRAVENOUS

## 2018-09-11 MED ORDER — ALLOPURINOL 300 MG PO TABS: 300.0000 mg | ORAL_TABLET | Freq: Every day | ORAL | Status: DC

## 2018-09-11 MED ORDER — MONTELUKAST SODIUM 10 MG PO TABS: 10.0000 mg | ORAL_TABLET | Freq: Every day | ORAL | Status: DC

## 2018-09-11 MED ORDER — DILTIAZEM HCL ER COATED BEADS 180 MG PO CP24: 180.0000 mg | ORAL_CAPSULE | Freq: Every day | ORAL | 3 refills | Status: DC

## 2018-09-11 MED ORDER — DILTIAZEM HCL ER COATED BEADS 180 MG PO CP24: 180.0000 mg | ORAL_CAPSULE | Freq: Every day | ORAL | Status: DC

## 2018-09-11 MED ORDER — HYDROCHLOROTHIAZIDE 12.5 MG PO TABS: 12.5000 mg | ORAL_TABLET | Freq: Every day | ORAL | 2 refills | Status: DC

## 2018-09-11 NOTE — Progress Notes (Signed)
   Jorge Morrison presented for a exercise cardiolite today.  No immediate complications.  Stress imaging is pending at this time.  Reino Bellis, NP 09/11/2018, 10:11 AM

## 2018-09-11 NOTE — Discharge Summary (Signed)
Physician Discharge Summary  Jorge Morrison IEP:[id number] DOB: [birth year] DOA: 09/09/2018  PCP: [name], MD  Admit date: 09/09/2018 Discharge date: 09/11/2018  Time spent:  minutes  Recommendations for Outpatient Follow-up:  1. Check TSH as outpatient in 4 to 6 weeks   Discharge Diagnoses:  Principal Problem:   Chest pain Active Problems:   SVT (supraventricular tachycardia) (HCC)   Elevated troponin   HLD (hyperlipidemia)   Gout   Hypothyroidism   Discharge Condition: Stable  Diet recommendation: Heart healthy diet  Filed Weights   09/09/18 2300 09/10/18 0110 09/11/18 0402  Weight: 117.9 kg 119.5 kg 118.3 kg    History of present illness:  73 year old male with a history of hypertension, hyperlipidemia, gout, hypothyroidism presents with shortness of breath and upper back pain.  Patient was found to be in SVT with heart rate around 200.  He was converted back to normal sinus rhythm after 1 dose of adenosine.  Placed under observation for cardiac stress test.  Hospital Course:   Chest pain/elevated troponin-patient had nuclear stress test which was low risk study.  Cardiology has seen the patient and cleared for discharge home.  Troponin elevation likely from demand ischemia from SVT.  SVT-converted back to normal sinus rhythm after 1 dose of adenosine.  He has been started on Cardizem 180 mg p.o. daily.  Norvasc has been discontinued.  He will need outpatient Holter monitor and follow-up cardiology as outpatient.  Hypertension-medications have been adjusted.  Norvasc has been discontinued and started on Cardizem 180 mg p.o. daily.  Will cut down the dose of HCTZ to 12.5 mg p.o. daily, continue Cozaar 100 mg p.o. daily  Hypothyroidism-TSH was mildly elevated 7.11, continue Synthroid 150 mcg daily.  He will need repeat TSH in 4 to 6 weeks and adjustment as needed as outpatient.    Procedures:  Nuclear cardiac stress  test  Consultations:  Cardiology  Discharge Exam: Vitals:   09/11/18 1009 09/11/18 1222  BP: (!) 159/86 138/79  Pulse: 75 60  Resp:  18  Temp:  97.9 F (36.6 C)  SpO2:  97%    General: Appears in no acute distress Cardiovascular: S1-S2, regular, no murmur auscultated Respiratory: Clear to auscultation bilaterally  Discharge Instructions   Discharge Instructions    Diet - low sodium heart healthy   Complete by:  As directed    Increase activity slowly   Complete by:  As directed      Allergies as of 09/11/2018      Reactions   Other Anaphylaxis   BLUE CHEESE      Medication List    STOP taking these medications   amLODipine 5 MG tablet Commonly known as:  NORVASC     TAKE these medications   allopurinol 300 MG tablet Commonly known as:  ZYLOPRIM Take 300 mg by mouth daily.   alprostadil 10 MCG injection Commonly known as:  EDEX 10 mcg by Intracavitary route as needed for erectile dysfunction.   atorvastatin 40 MG tablet Commonly known as:  LIPITOR Take 40 mg by mouth daily at 6 PM.   Biotin 10000 MCG Tabs Take 1 tablet by mouth daily.   cetirizine 10 MG tablet Commonly known as:  ZYRTEC Take 10 mg by mouth daily as needed for allergies.   colchicine 0.6 MG tablet Take 0.6 mg by mouth daily as needed (gout).   diltiazem 180 MG 24 hr capsule Commonly known as:  CARDIZEM CD Take 1 capsule (180 mg total) by mouth  daily. Start taking on:  September 12, 2018   EPIPEN 2-PAK 0.3 mg/0.3 mL Soaj injection Generic drug:  EPINEPHrine Inject 0.3 mg into the muscle once as needed for anaphylaxis.   fluticasone 27.5 MCG/SPRAY nasal spray Commonly known as:  VERAMYST Place 1 spray into the nose 2 (two) times daily.   Glucosamine-Chondroitin-MSM 375-300-250 MG Tabs Take 1 tablet by mouth daily.   hydrochlorothiazide 12.5 MG tablet Commonly known as:  HYDRODIURIL Take 1 tablet (12.5 mg total) by mouth daily. What changed:    medication  strength  how much to take   losartan 100 MG tablet Commonly known as:  COZAAR Take 100 mg by mouth daily.   montelukast 10 MG tablet Commonly known as:  SINGULAIR Take 10 mg by mouth at bedtime.   OMEGA-3 KRILL OIL PO Take 1 capsule by mouth every evening.   omeprazole 20 MG capsule Commonly known as:  PRILOSEC Take 20 mg by mouth daily.   PROBIOTIC PO Take 1 capsule by mouth daily.   PROVENTIL HFA 108 (90 Base) MCG/ACT inhaler Generic drug:  albuterol Inhale 2 puffs into the lungs every 6 (six) hours as needed for wheezing or shortness of breath.   SYNTHROID 150 MCG tablet Generic drug:  levothyroxine Take 150 mcg by mouth daily before breakfast.   Zinc 50 MG Caps Take 50 mg by mouth daily as needed (sick).      Allergies  Allergen Reactions  . Other Anaphylaxis    BLUE CHEESE      The results of significant diagnostics from this hospitalization (including imaging, microbiology, ancillary and laboratory) are listed below for reference.    Significant Diagnostic Studies: US Abdomen Complete  Result Date: 09/10/2018 CLINICAL DATA:  The nausea for 1 week. History of hypertension and prostate cancer. EXAM: ABDOMEN ULTRASOUND COMPLETE COMPARISON:  None. FINDINGS: Gallbladder: Gallbladder wall is 1.9 millimeters, within normal limits. No sonographic Murphy's sign. A 1.1 centimeter stone is identified within the gallbladder. Common bile duct: Diameter: 5.4 millimeters Liver: Small cyst in the RIGHT hepatic lobe is 0.8 x 0.7 x 0.7 centimeters no suspicious liver lesion. Portal vein is patent on color Doppler imaging with normal direction of blood flow towards the liver. IVC: No abnormality visualized. Pancreas: Visualized portion unremarkable. Spleen: Size and appearance within normal limits. Right Kidney: Length: 14.1 centimeters. Renal parenchyma is thin. No suspicious renal mass or hydronephrosis. Left Kidney: Length: 14.4 centimeters. Renal parenchyma is thin. No  suspicious renal mass or hydronephrosis. Abdominal aorta: No aneurysm visualized. Other findings: None. IMPRESSION: 1. Cholelithiasis without ultrasound evidence for acute cholecystitis. 2. Small liver cyst. 3. Mild renal parenchymal thinning bilaterally. Electronically Signed   By: Nolon Nations M.D.   On: 09/10/2018 10:07   Nm Myocar Multi W/spect W/wall Motion / Ef  Result Date: 09/11/2018  Blood pressure demonstrated a hypertensive response to exercise.  There was no ST segment deviation noted during stress.  No T wave inversion was noted during stress.  The study is normal.  This is a low risk study.  The left ventricular ejection fraction is mildly decreased (45-54%).  Diaphragmatic attenuation no ischemia EF estimated 47% but visually looks normal And EF was 60-65% by echo 09/10/18   Dg Chest Port 1 View  Result Date: 09/09/2018 CLINICAL DATA:  Epigastric abdominal pain. EXAM: PORTABLE CHEST 1 VIEW COMPARISON:  Radiographs of June 09, 2018. FINDINGS: The heart size and mediastinal contours are within normal limits. Both lungs are clear. The visualized skeletal structures are unremarkable. IMPRESSION:  No active disease. Electronically Signed   By: Marijo Conception, M.D.   On: 09/09/2018 16:52    Microbiology: No results found for this or any previous visit (from the past 240 hour(s)).   Labs: Basic Metabolic Panel: Recent Labs  Lab 09/09/18 1619 09/10/18 0500  NA 140 141  K 3.8 3.6  CL 102 105  CO2 27 30  GLUCOSE 152* 95  BUN 26* 14  CREATININE 1.28* 1.01  CALCIUM 9.3 8.9   Liver Function Tests: Recent Labs  Lab 09/09/18 1619 09/10/18 0500  AST 80* 35  ALT 80* 56*  ALKPHOS 110 72  BILITOT 1.1 0.9  PROT 7.1 5.7*  ALBUMIN 4.4 3.4*   Recent Labs  Lab 09/09/18 1619  LIPASE 30   No results for input(s): AMMONIA in the last 168 hours. CBC: Recent Labs  Lab 09/09/18 1619 09/10/18 0500 09/11/18 0334  WBC 9.6 4.8 3.5*  NEUTROABS 7.4 2.5  --   HGB 15.5  13.0 12.5*  HCT 47.4 38.0* 37.8*  MCV 92.2 90.3 91.5  PLT 206 131* 125*   Cardiac Enzymes: Recent Labs  Lab 09/09/18 1619 09/09/18 1903 09/09/18 2155 09/10/18 0500 09/10/18 1051  TROPONINI <0.03 0.08* 0.13* 0.16* 0.12*   BNP:     Signed:  Oswald Hillock MD.  [facility] 09/11/2018, 1:54 PM

## 2018-09-11 NOTE — Progress Notes (Signed)
   The nuclear study does not demonstrate evidence of ischemia.  Normal LV function.  He has tolerated oral diltiazem.  He did have hypertensive blood pressure response.  Will increase diltiazem from 120 mg daily to 180 mg daily.  Plan discharge today.  Clinical follow-up in 2 to 3 weeks.  Set up 30-day monitor to determine if there is PSVT occurring despite therapy.

## 2018-09-11 NOTE — Progress Notes (Signed)
Progress Note  Patient Name: Jorge Morrison Date of Encounter: 09/11/2018  Primary Cardiologist: No primary care provider on file.   Subjective   Patient doing well this AM without any CP or SOB. Discussed the plan for stress test and cardiac monitoring. Tolerating diltiazem without issues. All questions and concerns addressed.   Inpatient Medications    Scheduled Meds: . allopurinol  300 mg Oral QHS  . atorvastatin  40 mg Oral q1800  . diltiazem  120 mg Oral Daily  . enoxaparin (LOVENOX) injection  40 mg Subcutaneous Q24H  . levothyroxine  150 mcg Oral Q0600  . losartan  100 mg Oral Daily  . montelukast  10 mg Oral QHS   Continuous Infusions:  PRN Meds: acetaminophen **OR** acetaminophen, ondansetron **OR** ondansetron (ZOFRAN) IV   Vital Signs    Vitals:   09/10/18 1938 09/11/18 0041 09/11/18 0402 09/11/18 0404  BP: 124/67 125/81  119/78  Pulse: 71 63  62  Resp: 20 18  18   Temp: 98.4 F (36.9 C) 97.9 F (36.6 C)  98.1 F (36.7 C)  TempSrc: Oral Oral  Oral  SpO2: 96% 95%  94%  Weight:   118.3 kg   Height:        Intake/Output Summary (Last 24 hours) at 09/11/2018 0857 Last data filed at 09/11/2018 0708 Gross per 24 hour  Intake 240 ml  Output 2825 ml  Net -2585 ml   Filed Weights   09/09/18 2300 09/10/18 0110 09/11/18 0402  Weight: 117.9 kg 119.5 kg 118.3 kg   Telemetry    NSR - Personally Reviewed  ECG    No new EKG  Physical Exam   Today's Vitals   09/10/18 2047 09/11/18 0041 09/11/18 0402 09/11/18 0404  BP:  125/81  119/78  Pulse:  63  62  Resp:  18  18  Temp:  97.9 F (36.6 C)  98.1 F (36.7 C)  TempSrc:  Oral  Oral  SpO2:  95%  94%  Weight:   118.3 kg   Height:      PainSc: 0-No pain      Body mass index is 32.6 kg/m.  GEN: No acute distress.   Neck: No JVD Cardiac: RRR, no murmurs, rubs, or gallops.  Respiratory: Clear to auscultation bilaterally. GI: Soft, nontender, non-distended  MS: No edema; No deformity. Neuro:   Nonfocal  Psych: Normal affect   Labs    Chemistry Recent Labs  Lab 09/09/18 1619 09/10/18 0500  NA 140 141  K 3.8 3.6  CL 102 105  CO2 27 30  GLUCOSE 152* 95  BUN 26* 14  CREATININE 1.28* 1.01  CALCIUM 9.3 8.9  PROT 7.1 5.7*  ALBUMIN 4.4 3.4*  AST 80* 35  ALT 80* 56*  ALKPHOS 110 72  BILITOT 1.1 0.9  GFRNONAA 56* >60  GFRAA >60 >60  ANIONGAP 11 6    Hematology Recent Labs  Lab 09/09/18 1619 09/10/18 0500 09/11/18 0334  WBC 9.6 4.8 3.5*  RBC 5.14 4.21* 4.13*  HGB 15.5 13.0 12.5*  HCT 47.4 38.0* 37.8*  MCV 92.2 90.3 91.5  MCH 30.2 30.9 30.3  MCHC 32.7 34.2 33.1  RDW 14.0 14.1 13.7  PLT 206 131* 125*   Cardiac Enzymes Recent Labs  Lab 09/09/18 1903 09/09/18 2155 09/10/18 0500 09/10/18 1051  TROPONINI 0.08* 0.13* 0.16* 0.12*   No results for input(s): TROPIPOC in the last 168 hours.   BNPNo results for input(s): BNP, PROBNP in the last 168 hours.  DDimer No results for input(s): DDIMER in the last 168 hours.   Radiology    US Abdomen Complete  Result Date: 09/10/2018 CLINICAL DATA:  The nausea for 1 week. History of hypertension and prostate cancer. EXAM: ABDOMEN ULTRASOUND COMPLETE COMPARISON:  None. FINDINGS: Gallbladder: Gallbladder wall is 1.9 millimeters, within normal limits. No sonographic Murphy's sign. A 1.1 centimeter stone is identified within the gallbladder. Common bile duct: Diameter: 5.4 millimeters Liver: Small cyst in the RIGHT hepatic lobe is 0.8 x 0.7 x 0.7 centimeters no suspicious liver lesion. Portal vein is patent on color Doppler imaging with normal direction of blood flow towards the liver. IVC: No abnormality visualized. Pancreas: Visualized portion unremarkable. Spleen: Size and appearance within normal limits. Right Kidney: Length: 14.1 centimeters. Renal parenchyma is thin. No suspicious renal mass or hydronephrosis. Left Kidney: Length: 14.4 centimeters. Renal parenchyma is thin. No suspicious renal mass or hydronephrosis.  Abdominal aorta: No aneurysm visualized. Other findings: None. IMPRESSION: 1. Cholelithiasis without ultrasound evidence for acute cholecystitis. 2. Small liver cyst. 3. Mild renal parenchymal thinning bilaterally. Electronically Signed   By: Nolon Nations M.D.   On: 09/10/2018 10:07   Dg Chest Port 1 View  Result Date: 09/09/2018 CLINICAL DATA:  Epigastric abdominal pain. EXAM: PORTABLE CHEST 1 VIEW COMPARISON:  Radiographs of June 09, 2018. FINDINGS: The heart size and mediastinal contours are within normal limits. Both lungs are clear. The visualized skeletal structures are unremarkable. IMPRESSION: No active disease. Electronically Signed   By: Marijo Conception, M.D.   On: 09/09/2018 16:52   Cardiac Studies   TTE 09/10/2018  1. The left ventricle has normal systolic function with an ejection fraction of 60-65%. The cavity size was normal. There is moderately increased left ventricular wall thickness. Left ventricular diastolic parameters were normal.  2. The right ventricle has normal systolic function. The cavity was normal. There is no increase in right ventricular wall thickness.  3. The mitral valve is normal in structure. Mild thickening of the mitral valve leaflet. Mild calcification of the mitral valve leaflet. There is mild mitral annular calcification present.  4. The tricuspid valve is normal in structure.  5. The aortic valve is tricuspid Mild thickening of the aortic valve Mild calcification of the aortic valve.  6. The pulmonic valve was normal in structure.  7. There is mild dilatation of the aortic root measuring 42 mm.  Patient Profile     Jorge Morrison is a 73 y.o. 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 - Started Diltiazem 120 mg QD on 09/10/2018. Plans to increase to target dose of 240-360 mg QD - Plan for 30 day cardiac monitor. If SVT uncontrolled may need referral to EP for ablation   Elevated Troponin  - Moderate risk for CAD. Will plan for perfusion study today.   HLD - Continue Atorvastatin   Hypertension  - Continue losartan, HCTZ, and diltiazem   Will discuss the case further with Dr. Tamala Julian.  For questions or updates, please contact [place] Please consult [email or web address] for contact info under Cardiology/STEMI.   Signed, Ina Homes, MD  09/11/2018, 8:57 AM

## 2018-09-24 ENCOUNTER — Ambulatory Visit (INDEPENDENT_AMBULATORY_CARE_PROVIDER_SITE_OTHER): Payer: Medicare HMO

## 2018-09-24 ENCOUNTER — Other Ambulatory Visit: Payer: Self-pay | Admitting: Cardiology

## 2018-09-24 DIAGNOSIS — R002 Palpitations: Secondary | ICD-10-CM | POA: Diagnosis not present

## 2018-09-24 DIAGNOSIS — I471 Supraventricular tachycardia: Secondary | ICD-10-CM

## 2018-09-25 DIAGNOSIS — K8020 Calculus of gallbladder without cholecystitis without obstruction: Secondary | ICD-10-CM | POA: Diagnosis not present

## 2018-09-25 DIAGNOSIS — I10 Essential (primary) hypertension: Secondary | ICD-10-CM | POA: Diagnosis not present

## 2018-09-25 DIAGNOSIS — Z09 Encounter for follow-up examination after completed treatment for conditions other than malignant neoplasm: Secondary | ICD-10-CM | POA: Diagnosis not present

## 2018-09-25 DIAGNOSIS — E039 Hypothyroidism, unspecified: Secondary | ICD-10-CM | POA: Diagnosis not present

## 2018-09-25 DIAGNOSIS — E7800 Pure hypercholesterolemia, unspecified: Secondary | ICD-10-CM | POA: Diagnosis not present

## 2018-09-25 DIAGNOSIS — R1011 Right upper quadrant pain: Secondary | ICD-10-CM | POA: Diagnosis not present

## 2018-09-25 DIAGNOSIS — R748 Abnormal levels of other serum enzymes: Secondary | ICD-10-CM | POA: Diagnosis not present

## 2018-09-25 DIAGNOSIS — I471 Supraventricular tachycardia: Secondary | ICD-10-CM | POA: Diagnosis not present

## 2018-09-25 DIAGNOSIS — E790 Hyperuricemia without signs of inflammatory arthritis and tophaceous disease: Secondary | ICD-10-CM | POA: Diagnosis not present

## 2018-09-29 DIAGNOSIS — M1712 Unilateral primary osteoarthritis, left knee: Secondary | ICD-10-CM | POA: Diagnosis not present

## 2018-10-02 ENCOUNTER — Encounter: Payer: Self-pay | Admitting: Nurse Practitioner

## 2018-10-07 ENCOUNTER — Ambulatory Visit: Payer: Medicare HMO | Admitting: Nurse Practitioner

## 2018-10-13 ENCOUNTER — Telehealth: Payer: Self-pay | Admitting: Nurse Practitioner

## 2018-10-13 NOTE — Telephone Encounter (Signed)
Patient still with monitor in place for one more week.   No current symptoms.   Will wait to reschedule once monitor results are know - to consider telehealth visit. Patient is aware. Appointment for tomorrow has been cancelled.   Burtis Junes, RN, [place] [address] [place], [place]  [phone]

## 2018-10-13 NOTE — Telephone Encounter (Signed)
Primary Cardiologist: Tamala Julian  Pt contacted. History and symptoms reviewed - admitted last month with chest pain/SVT/elevated troponin.  Patient will f/u with [facility] provider as scheduled. Pt advised that we are restricting visitors at this time and request that only patients present for check in prior to their appointment. All other visitors should remain in their car. If necessary, only one visitor may come with the patient, into the building. For everyone's safety, all patients and visitors entering our practice area should expect to be screened again prior to entering our waiting area.   Burtis Junes, RN, [place] [address] [place], [place]  [address] [phone]

## 2018-10-13 NOTE — Telephone Encounter (Signed)
error 

## 2018-10-13 NOTE — Progress Notes (Deleted)
Primary Cardiologist: Tamala Julian  Pt contacted. History and symptoms reviewed - Admission last month for chest pain/SVT - elevated troponin.  Patient will f/u with [facility] provider as scheduled. Pt advised that we are restricting visitors at this time and request that only patients present for check in prior to their appointment. All other visitors should remain in their car. If necessary, only one visitor may come with the patient, into the building. For everyone's safety, all patients and visitors entering our practice area should expect to be screened again prior to entering our waiting area.   Burtis Junes, RN, [place] [address] [place], [place]  [phone]

## 2018-10-14 ENCOUNTER — Ambulatory Visit: Payer: Medicare HMO | Admitting: Nurse Practitioner

## 2018-10-20 DIAGNOSIS — E039 Hypothyroidism, unspecified: Secondary | ICD-10-CM | POA: Diagnosis not present

## 2018-10-20 DIAGNOSIS — E790 Hyperuricemia without signs of inflammatory arthritis and tophaceous disease: Secondary | ICD-10-CM | POA: Diagnosis not present

## 2018-10-20 DIAGNOSIS — K8020 Calculus of gallbladder without cholecystitis without obstruction: Secondary | ICD-10-CM | POA: Diagnosis not present

## 2018-10-20 DIAGNOSIS — R1011 Right upper quadrant pain: Secondary | ICD-10-CM | POA: Diagnosis not present

## 2018-10-20 DIAGNOSIS — E7800 Pure hypercholesterolemia, unspecified: Secondary | ICD-10-CM | POA: Diagnosis not present

## 2018-10-20 DIAGNOSIS — I10 Essential (primary) hypertension: Secondary | ICD-10-CM | POA: Diagnosis not present

## 2018-10-20 DIAGNOSIS — I471 Supraventricular tachycardia: Secondary | ICD-10-CM | POA: Diagnosis not present

## 2018-11-05 DIAGNOSIS — R69 Illness, unspecified: Secondary | ICD-10-CM | POA: Diagnosis not present

## 2018-11-11 DIAGNOSIS — M1712 Unilateral primary osteoarthritis, left knee: Secondary | ICD-10-CM | POA: Diagnosis not present

## 2018-11-18 ENCOUNTER — Telehealth: Payer: Self-pay | Admitting: Interventional Cardiology

## 2018-11-18 DIAGNOSIS — M1712 Unilateral primary osteoarthritis, left knee: Secondary | ICD-10-CM | POA: Diagnosis not present

## 2018-11-18 NOTE — Telephone Encounter (Signed)
lpmtcb 4/28

## 2018-11-18 NOTE — Telephone Encounter (Signed)
Pt c/o BP issue: STAT if pt c/o blurred vision, one-sided weakness or slurred speech  1. What are your last 5 BP readings?  146/93 and heart rate is 73 today- last night it was 126/80 and heart rate rate 79- yesterday morning it was 151/92 and heart rate was 62  2. Are you having any other symptoms (ex. Dizziness, headache, blurred vision, passed out)? no   3. What is your BP issue? Blood pressure been running  up - the high was 158/91 and the low was 120/75    Pt also would like his monitor results please. He said it was on My-Chart, but need it to be explained to him please.Jorge Morrison

## 2018-11-18 NOTE — Telephone Encounter (Signed)
Patient is returning your call.  

## 2018-11-18 NOTE — Telephone Encounter (Signed)
The patient is calling because he wanted to inform Dr Tamala Julian that his BP has been fluctuating lately.  He reminded me that he is soon to have his gall bladder removed so when that bothers him, his BP is up and otherwise asymptomatic.  We also discussed his monitor results and he was thankful for that interpretation.    I noticed that he was scheduled to see Cecille Rubin 3/24, but that was canceled and planned to be rescheduled after the monitor was removed.  I am not sure if he should see Dr Tamala Julian at this time or reschedule with Cecille Rubin.  Please advise, thank you.

## 2018-11-20 ENCOUNTER — Telehealth: Payer: Self-pay | Admitting: Interventional Cardiology

## 2018-11-20 NOTE — Telephone Encounter (Signed)
Patient set up for MyChart? Yes consent sent through mychart   Is patient using Smartphone/computer/tablet?smartphone  Did audio/video work?  Does patient need telephone visit? no  Best phone number to use? [phone]  Special Instructions? Patient will have bp , wt and medication list day of appt, pt is going to send over consent and a list of his bp for the last month as well     Virtual Visit Pre-Appointment Phone Call  "(Name), I am calling you today to discuss your upcoming appointment. We are currently trying to limit exposure to the virus that causes COVID-19 by seeing patients at home rather than in the office."  1. "What is the BEST phone number to call the day of the visit?" - include this in appointment notes  2. Do you have or have access to (through a family member/friend) a smartphone with video capability that we can use for your visit?" a. If yes - list this number in appt notes as cell (if different from BEST phone #) and list the appointment type as a VIDEO visit in appointment notes b. If no - list the appointment type as a PHONE visit in appointment notes  3. Confirm consent - "In the setting of the current Covid19 crisis, you are scheduled for a (phone or video) visit with your provider on (date) at (time).  Just as we do with many in-office visits, in order for you to participate in this visit, we must obtain consent.  If you'd like, I can send this to your mychart (if signed up) or email for you to review.  Otherwise, I can obtain your verbal consent now.  All virtual visits are billed to your insurance company just like a normal visit would be.  By agreeing to a virtual visit, we'd like you to understand that the technology does not allow for your provider to perform an examination, and thus may limit your provider's ability to fully assess your condition. If your provider identifies any concerns that need to be evaluated in person, we will make arrangements to  do so.  Finally, though the technology is pretty good, we cannot assure that it will always work on either your or our end, and in the setting of a video visit, we may have to convert it to a phone-only visit.  In either situation, we cannot ensure that we have a secure connection.  Are you willing to proceed?" STAFF: Did the patient verbally acknowledge consent to telehealth visit? Document YES/NO here: yes  4. Advise patient to be prepared - "Two hours prior to your appointment, go ahead and check your blood pressure, pulse, oxygen saturation, and your weight (if you have the equipment to check those) and write them all down. When your visit starts, your provider will ask you for this information. If you have an Apple Watch or Kardia device, please plan to have heart rate information ready on the day of your appointment. Please have a pen and paper handy nearby the day of the visit as well."  5. Give patient instructions for MyChart download to smartphone OR Doximity/Doxy.me as below if video visit (depending on what platform provider is using)  6. Inform patient they will receive a phone call 15 minutes prior to their appointment time (may be from unknown caller ID) so they should be prepared to answer    TELEPHONE CALL NOTE  [name] has been deemed a candidate for a follow-up tele-health visit to limit community  exposure during the Covid-19 pandemic. I spoke with the patient via phone to ensure availability of phone/video source, confirm preferred email & phone number, and discuss instructions and expectations.  I reminded Jorge Morrison to be prepared with any vital sign and/or heart rhythm information that could potentially be obtained via home monitoring, at the time of his visit. I reminded [name] to expect a phone call prior to his visit.  [name] 11/20/2018 1:47 PM   INSTRUCTIONS FOR DOWNLOADING THE MYCHART APP TO SMARTPHONE  - The patient must first  make sure to have activated MyChart and know their login information - If Apple, go to [employer] and type in MyChart in the search bar and download the app. If Android, ask patient to go to [employer] and type in [place] in the search bar and download the app. The app is free but as with any other app downloads, their phone may require them to verify saved payment information or Apple/Android password.  - The patient will need to then log into the app with their MyChart username and password, and select [facility] as their healthcare provider to link the account. When it is time for your visit, go to the MyChart app, find appointments, and click Begin Video Visit. Be sure to Select Allow for your device to access the Microphone and Camera for your visit. You will then be connected, and your provider will be with you shortly.  **If they have any issues connecting, or need assistance please contact MyChart service desk [phone])**  **If using a computer, in order to ensure the best quality for their visit they will need to use either of the following Internet Browsers: [employer], or Google Chrome**  IF USING DOXIMITY or DOXY.ME - The patient will receive a link just prior to their visit by text.     FULL LENGTH CONSENT FOR TELE-HEALTH VISIT   I hereby voluntarily request, consent and authorize [facility] and its employed or contracted physicians, physician assistants, nurse practitioners or other licensed health care professionals (the Practitioner), to provide me with telemedicine health care services (the Services") as deemed necessary by the treating Practitioner. I acknowledge and consent to receive the Services by the Practitioner via telemedicine. I understand that the telemedicine visit will involve communicating with the Practitioner through live audiovisual communication technology and the disclosure of certain medical information by electronic transmission.  I acknowledge that I have been given the opportunity to request an in-person assessment or other available alternative prior to the telemedicine visit and am voluntarily participating in the telemedicine visit.  I understand that I have the right to withhold or withdraw my consent to the use of telemedicine in the course of my care at any time, without affecting my right to future care or treatment, and that the Practitioner or I may terminate the telemedicine visit at any time. I understand that I have the right to inspect all information obtained and/or recorded in the course of the telemedicine visit and may receive copies of available information for a reasonable fee.  I understand that some of the potential risks of receiving the Services via telemedicine include:   Delay or interruption in medical evaluation due to technological equipment failure or disruption;  Information transmitted may not be sufficient (e.g. poor resolution of images) to allow for appropriate medical decision making by the Practitioner; and/or   In rare instances, security protocols could fail, causing a breach of personal  health information.  Furthermore, I acknowledge that it is my responsibility to provide information about my medical history, conditions and care that is complete and accurate to the best of my ability. I acknowledge that Practitioner's advice, recommendations, and/or decision may be based on factors not within their control, such as incomplete or inaccurate data provided by me or distortions of diagnostic images or specimens that may result from electronic transmissions. I understand that the practice of medicine is not an exact science and that Practitioner makes no warranties or guarantees regarding treatment outcomes. I acknowledge that I will receive a copy of this consent concurrently upon execution via email to the email address I last provided but may also request a printed copy by calling the office  of [place].    I understand that my insurance will be billed for this visit.   I have read or had this consent read to me.  I understand the contents of this consent, which adequately explains the benefits and risks of the Services being provided via telemedicine.   I have been provided ample opportunity to ask questions regarding this consent and the Services and have had my questions answered to my satisfaction.  I give my informed consent for the services to be provided through the use of telemedicine in my medical care  By participating in this telemedicine visit I agree to the above.

## 2018-11-20 NOTE — Telephone Encounter (Signed)
  Patient called back and made appt for 3:30 on 11/24/18 to do video visit. Please call to go over options.

## 2018-11-20 NOTE — Telephone Encounter (Signed)
Left message for patient to call back regarding a virtual appt with Dr Tamala Julian for 5/4 either 8a or 330p , if patient agrees need to know if phone or video and needs consent form sent. Please put through to my cell [phone]

## 2018-11-23 NOTE — Progress Notes (Signed)
Virtual Visit via Video Note   This visit type was conducted due to national recommendations for restrictions regarding the COVID-19 Pandemic (e.g. social distancing) in an effort to limit this patient's exposure and mitigate transmission in our community.  Due to his co-morbid illnesses, this patient is at least at moderate risk for complications without adequate follow up.  This format is felt to be most appropriate for this patient at this time.  All issues noted in this document were discussed and addressed.  A limited physical exam was performed with this format.  Please refer to the patient's chart for his consent to telehealth for [facility].   Date:  11/24/2018   ID:  Jorge Morrison, DOB 07-18-1946, MRN [id number]  Patient Location: Home Provider Location: Office  PCP:  Jorge Shanks, MD  Cardiologist:  No primary care provider on file.  Electrophysiologist:  None   Evaluation Performed:  Follow-Up Visit  Chief Complaint:  SVT  History of Present Illness:    Jorge Morrison is a 73 y.o. 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  Jorge Morrison is doing well.  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  He is tolerating diltiazem which is being used instead of amlodipine.  This will treat both his hypertension and help with preventing recurrent AVNRT.  No chest discomfort.  Cardiac work-up while hospitalized was negative for evidence of symptomatic coronary disease.  Low risk nuclear study and normal LV function by echo.  The patient does not have symptoms concerning for COVID-19 infection (fever, chills, cough, or new shortness of breath).    Past Medical History:  Diagnosis Date  . Chest pain 09/09/2018  . Elevated troponin 09/10/2018  . GERD (gastroesophageal reflux disease)   . Gout 09/10/2018  . High cholesterol   . HLD (hyperlipidemia) 09/10/2018  . Hypertension   .  Hypothyroidism 09/10/2018  . Prostate cancer ([place])   . SVT (supraventricular tachycardia) ([place]) 09/10/2018  . Thyroid disease    Past Surgical History:  Procedure Laterality Date  . APPENDECTOMY    . PROSTATECTOMY       Current Meds  Medication Sig  . albuterol (PROVENTIL HFA) 108 (90 BASE) MCG/ACT inhaler Inhale 2 puffs into the lungs every 6 (six) hours as needed for wheezing or shortness of breath.  . allopurinol (ZYLOPRIM) 300 MG tablet Take 300 mg by mouth daily.  Marland Kitchen alprostadil (EDEX) 10 MCG injection 10 mcg by Intracavitary route as needed for erectile dysfunction.   Marland Kitchen aspirin EC 81 MG tablet Take 81 mg by mouth 2 (two) times a day.  Marland Kitchen atorvastatin (LIPITOR) 40 MG tablet Take 40 mg by mouth daily at 6 PM.   . cetirizine (ZYRTEC) 10 MG tablet Take 10 mg by mouth daily as needed for allergies.  Marland Kitchen colchicine 0.6 MG tablet Take 0.6 mg by mouth daily as needed (gout).  Marland Kitchen EPINEPHrine (EPIPEN 2-PAK) 0.3 mg/0.3 mL IJ SOAJ injection Inject 0.3 mg into the muscle once as needed for anaphylaxis.   . fluticasone (VERAMYST) 27.5 MCG/SPRAY nasal spray Place 1 spray into the nose 2 (two) times daily.  . Glucosamine-Chondroitin-MSM 375-300-250 MG TABS Take 1 tablet by mouth daily.  . hydrochlorothiazide (HYDRODIURIL) 12.5 MG tablet Take 1 tablet (12.5 mg total) by mouth daily.  Marland Kitchen levothyroxine (SYNTHROID) 150 MCG tablet Take 150 mcg by mouth daily before breakfast. 163mcg 4 days/week; 166mcg Monday, Wed, Friday  . levothyroxine (  SYNTHROID) 175 MCG tablet Take 1 tablet by mouth 3 (three) times a week. 137mcg 4 days/week; 147mcg Monday, Wed, Friday  . losartan (COZAAR) 100 MG tablet Take 100 mg by mouth daily.  . montelukast (SINGULAIR) 10 MG tablet Take 10 mg by mouth at bedtime.  . OMEGA-3 KRILL OIL PO Take 1 capsule by mouth every evening.   Marland Kitchen omeprazole (PRILOSEC) 20 MG capsule Take 20 mg by mouth daily.  . Probiotic Product (PROBIOTIC PO) Take 1 capsule by mouth daily.  . Zinc 50 MG CAPS Take  50 mg by mouth daily as needed (sick).  . [DISCONTINUED] diltiazem (CARDIZEM CD) 180 MG 24 hr capsule Take 1 capsule (180 mg total) by mouth daily.     Allergies:   Other   Social History   Tobacco Use  . Smoking status: Former Research scientist (life sciences)  . Smokeless tobacco: Never Used  Substance Use Topics  . Alcohol use: Yes    Comment: daily  . Drug use: No     Family Hx: The patient's family history includes CAD in his brother and mother.  ROS:   Please see the history of present illness.    He is concerned about his blood pressure which is been somewhat.  He has been measuring the blood pressure several times per day. variable. He will be seeing Dr. Rosendo Morrison concerning cholecystectomy in the not too distant future. All other systems reviewed and are negative.   Prior CV studies:   The following studies were reviewed today:   ECHO EF 09/11/18: EF 65%  Blood pressure demonstrated a hypertensive response to exercise.  There was no ST segment deviation noted during stress.  No T wave inversion was noted during stress.  The study is normal.  This is a low risk study.  The left ventricular ejection fraction is mildly decreased (45-54%).   ECHOCARDIOGRAM 08/2018: IMPRESSIONS 1. The left ventricle has normal systolic function with an ejection fraction of 60-65%. The cavity size was normal. There is moderately increased left ventricular wall thickness. Left ventricular diastolic parameters were normal.  2. The right ventricle has normal systolic function. The cavity was normal. There is no increase in right ventricular wall thickness.  3. The mitral valve is normal in structure. Mild thickening of the mitral valve leaflet. Mild calcification of the mitral valve leaflet. There is mild mitral annular calcification present.  4. The tricuspid valve is normal in structure.  5. The aortic valve is tricuspid Mild thickening of the aortic valve Mild calcification of the aortic valve.  6. The  pulmonic valve was normal in structure.  7. There is mild dilatation of the aortic root measuring 42 mm.  30 day monitor 09/2018: Study Highlights    Basic underlying rhythm is normal sinus rhythm.  No significant arrhythmias are noted.  No complaints identified.  Heart rate range during. Of monitoring is 42 to 119 bpm.     Labs/Other Tests and Data Reviewed:    EKG:  No ECG reviewed.  Recent Labs: 09/09/2018: TSH 7.110 09/10/2018: ALT 56; BUN 14; Creatinine, Ser 1.01; Potassium 3.6; Sodium 141 09/11/2018: Hemoglobin 12.5; Platelets 125   Recent Lipid Panel No results found for: CHOL, TRIG, HDL, CHOLHDL, LDLCALC, LDLDIRECT  Wt Readings from Last 3 Encounters:  11/24/18 259 lb (117.5 kg)  09/11/18 260 lb 12.8 oz (118.3 kg)  01/22/14 260 lb (117.9 kg)     Objective:    Vital Signs:  BP (!) 143/99   Pulse 88   Ht 6'  3" (1.905 m)   Wt 259 lb (117.5 kg)   BMI 32.37 kg/m    VITAL SIGNS:  reviewed GEN:  no acute distress RESPIRATORY:  normal respiratory effort, symmetric expansion NEURO:  alert and oriented x 3, no obvious focal deficit  ASSESSMENT & PLAN:    1. SVT (supraventricular tachycardia) ([place])   2. Pre-operative clearance   3. Essential hypertension   4. Mixed hyperlipidemia   5. 2019 novel coronavirus disease (COVID-19)    PLAN:  1. He has not had a recurrence of SVT since discharge from the hospital in February.  A prolonged monitor did not demonstrate any episodes of SVT.  We will further increase diltiazem dose today to better help with blood pressure control. 2. The patient is cleared for upcoming cholecystectomy by Dr. Rosendo Morrison based upon the work-up done in February when he presented with SVT.  No further work-up is needed. 3. Increase diltiazem to 240 mg/day.  Continue to monitor the blood pressure with anticipated target of 130/80 mmHg or less.  Low-salt diet, avoid nonsteroidal anti-inflammatory therapy, decrease/minimize alcohol intake, moderate  aerobic activity, and weight loss all discussed. 4. Target LDL given his history of PSVT, hypertension, and obesity should be 70 mg/dL.  Plan clinical follow-up in 3 months.  EKG will be done at that time.  He will continue to monitor blood pressure and keep [place] informed.  Further adjustments will be performed if needed.  COVID-19 Education: The signs and symptoms of COVID-19 were discussed with the patient and how to seek care for testing (follow up with PCP or arrange E-visit).  The importance of social distancing was discussed today.  Time:   Today, I have spent 18 minutes with the patient with telehealth technology discussing the above problems.     Medication Adjustments/Labs and Tests Ordered: Current medicines are reviewed at length with the patient today.  Concerns regarding medicines are outlined above.   Tests Ordered: No orders of the defined types were placed in this encounter.   Medication Changes: Meds ordered this encounter  Medications  . diltiazem (CARDIZEM CD) 240 MG 24 hr capsule    Sig: Take 1 capsule (240 mg total) by mouth daily.    Dispense:  90 capsule    Refill:  3    Dose change    Disposition:  Follow up in 3 month(s)  Signed, Sinclair Grooms, MD  11/24/2018 4:03 PM    [place]

## 2018-11-24 ENCOUNTER — Telehealth (INDEPENDENT_AMBULATORY_CARE_PROVIDER_SITE_OTHER): Payer: Medicare HMO | Admitting: Interventional Cardiology

## 2018-11-24 ENCOUNTER — Encounter: Payer: Self-pay | Admitting: Interventional Cardiology

## 2018-11-24 ENCOUNTER — Other Ambulatory Visit: Payer: Self-pay

## 2018-11-24 VITALS — BP 143/99 | HR 88 | Ht 75.0 in | Wt 259.0 lb

## 2018-11-24 DIAGNOSIS — I10 Essential (primary) hypertension: Secondary | ICD-10-CM

## 2018-11-24 DIAGNOSIS — E782 Mixed hyperlipidemia: Secondary | ICD-10-CM

## 2018-11-24 DIAGNOSIS — I471 Supraventricular tachycardia: Secondary | ICD-10-CM | POA: Diagnosis not present

## 2018-11-24 DIAGNOSIS — U071 COVID-19: Secondary | ICD-10-CM

## 2018-11-24 DIAGNOSIS — Z01818 Encounter for other preprocedural examination: Secondary | ICD-10-CM

## 2018-11-24 MED ORDER — DILTIAZEM HCL ER COATED BEADS 240 MG PO CP24: 240.0000 mg | ORAL_CAPSULE | Freq: Every day | ORAL | 3 refills | Status: DC

## 2018-11-24 NOTE — Patient Instructions (Signed)
Medication Instructions:  1) INCREASE Diltiazem to 240mg  once daily  If you need a refill on your cardiac medications before your next appointment, please call your pharmacy.   Lab work: None If you have labs (blood work) drawn today and your tests are completely normal, you will receive your results only by: Marland Kitchen MyChart Message (if you have MyChart) OR . A paper copy in the mail If you have any lab test that is abnormal or we need to change your treatment, we will call you to review the results.  Testing/Procedures: None  Follow-Up: At [facility], you and your health needs are our priority.  As part of our continuing mission to provide you with exceptional heart care, we have created designated Provider Care Teams.  These Care Teams include your primary Cardiologist (physician) and Advanced Practice Providers (APPs -  Physician Assistants and Nurse Practitioners) who all work together to provide you with the care you need, when you need it. You will need a follow up appointment in 2-3 months.  Please call our office 2 months in advance to schedule this appointment.  You may see Dr. Tamala Julian or one of the following Advanced Practice Providers on your designated Care Team:   Truitt Merle, NP Cecilie Kicks, NP . Kathyrn Drown, NP  Any Other Special Instructions Will Be Listed Below (If Applicable).  Monitor your blood pressure 2-3 times per week.  Make sure this is 2-3 hours after your morning medications.

## 2018-11-25 DIAGNOSIS — M1712 Unilateral primary osteoarthritis, left knee: Secondary | ICD-10-CM | POA: Diagnosis not present

## 2018-11-27 DIAGNOSIS — K8020 Calculus of gallbladder without cholecystitis without obstruction: Secondary | ICD-10-CM | POA: Diagnosis not present

## 2018-12-04 ENCOUNTER — Ambulatory Visit: Payer: Self-pay | Admitting: General Surgery

## 2018-12-04 NOTE — H&P (Signed)
History of Present Illness Ralene Ok MD; 11/27/2018 11:30 AM) The patient is a 73 year old male who presents for evaluation of gall stones. Referred by: Dr. Leighton Ruff Chief Complaint: Abdominal pain  Patient is a 73 year old male with a history of SVT, hypertension, hyperlipidemia, gout, who comes in with a 6 month history of epigastric abdominal pain. Patient was recently worked up secondary to SVT. He states he began with epigastric abdominal pain, shortness of breath. Patient was seen and worked up in the ER and had a CT scan under control. Patient follow-up with Dr. Tamala Julian for his SVT and placed on diltiazem.  Patient has had previous episodes of epigastric abdominal pain as well as right upper quadrant abdominal pain or normal physical exams. Patient underwent ultrasound which I did review personally which revealed multiple gallstones. Patient had LFTs which were within normal limits.  Patient is fairly active, cycles, and lift weights.   Past Surgical History Emeline Gins, [place]; 11/27/2018 11:06 AM) Appendectomy  Prostate Surgery - Removal  Vasectomy   Allergies Emeline Gins, [place]; 11/27/2018 11:07 AM) No Known Drug Allergies [11/27/2018]: Allergies Reconciled   Medication History Emeline Gins, CMA; 11/27/2018 11:10 AM) Allopurinol (300MG Tablet, Oral) Active. Edex (10MCG Kit, Intracavernosal) Active. Aspirin (81MG Tablet, Oral) Active. Atorvastatin Calcium (40MG Tablet, Oral) Active. Cetirizine HCl (10MG Tablet, Oral) Active. Colchicine (0.6MG Tablet, Oral) Active. EPINEPHrine (0.3MG/0.3ML Soln Auto-inj, Injection) Active. Levothyroxine Sodium (175MCG Tablet, Oral) Active. Losartan Potassium (100MG Tablet, Oral) Active. Cartia XT (240MG Capsule ER 24HR, Oral) Active. Diltiazem HCl CR (240MG/24HR Capsule ER 24HR, Oral) Active. Medications Reconciled  Social History Emeline Gins, [place]; 11/27/2018 11:06 AM) Alcohol use  Moderate  alcohol use. Caffeine use  Tea. Tobacco use  Former smoker.  Family History Emeline Gins, [place]; 11/27/2018 11:06 AM) Heart Disease  Brother, Family Members In General, Mother. Heart disease in male family member before age 54  Hypertension  Brother, Mother. Thyroid problems  Brother, Sister.  Other Problems Emeline Gins, [place]; 11/27/2018 11:06 AM) Cholelithiasis  Gastroesophageal Reflux Disease  Hemorrhoids  High blood pressure  Hypercholesterolemia  Prostate Cancer  Thyroid Disease     Review of Systems Ralene Ok MD; 11/27/2018 11:28 AM) General Not Present- Appetite Loss, Chills, Fatigue, Fever, Night Sweats, Weight Gain and Weight Loss. HEENT Present- Hearing Loss, Ringing in the Ears, Seasonal Allergies, Sinus Pain and Wears glasses/contact lenses. Not Present- Earache, Hoarseness, Nose Bleed, Oral Ulcers, Sore Throat, Visual Disturbances and Yellow Eyes. Respiratory Not Present- Bloody sputum, Chronic Cough, Difficulty Breathing, Snoring and Wheezing. Breast Not Present- Breast Mass, Breast Pain, Nipple Discharge and Skin Changes. Cardiovascular Present- Leg Cramps. Not Present- Chest Pain, Difficulty Breathing Lying Down, Palpitations, Rapid Heart Rate, Shortness of Breath and Swelling of Extremities. Gastrointestinal Present- Abdominal Pain, Constipation, Excessive gas, Hemorrhoids and Indigestion. Not Present- Bloating, Bloody Stool, Change in Bowel Habits, Chronic diarrhea, Difficulty Swallowing, Gets full quickly at meals, Nausea, Rectal Pain and Vomiting. Male Genitourinary Present- Impotence and Urine Leakage. Not Present- Blood in Urine, Change in Urinary Stream, Frequency, Nocturia, Painful Urination and Urgency. Musculoskeletal Present- Joint Pain and Joint Stiffness. Not Present- Back Pain, Muscle Pain, Muscle Weakness and Swelling of Extremities. Neurological Not Present- Decreased Memory, Fainting, Headaches, Numbness, Seizures, Tingling, Tremor,  Trouble walking and Weakness. Psychiatric Not Present- Anxiety, Bipolar, Change in Sleep Pattern, Depression, Fearful and Frequent crying. Endocrine Not Present- Cold Intolerance, Excessive Hunger, Hair Changes, Heat Intolerance, Hot flashes and New Diabetes. Hematology Present- Gland problems. Not Present- Blood Thinners, Easy Bruising, Excessive bleeding,  HIV and Persistent Infections. All other systems negative  Vitals Emeline Gins CMA; 11/27/2018 11:07 AM) 11/27/2018 11:07 AM Weight: 259.8 lb Height: 75in Body Surface Area: 2.45 m Body Mass Index: 32.47 kg/m  Temp.: 98.56F  Pulse: 66 (Regular)  BP: 140/84 (Sitting, Left Arm, Standard)       Physical Exam Ralene Ok MD; 11/27/2018 11:30 AM) The physical exam findings are as follows: Note:Constitutional: No acute distress, conversant, appears stated age  Eyes: Anicteric sclerae, moist conjunctiva, no lid lag  Neck: No thyromegaly, trachea midline, no cervical lymphadenopathy  Lungs: Clear to auscultation biilaterally, normal respiratory effot  Cardiovascular: regular rate & rhythm, no murmurs, no peripheal edema, pedal pulses 2+  GI: Soft, no masses or hepatosplenomegaly, tender to palpation right upper quadrant  MSK: Normal gait, no clubbing cyanosis, edema  Skin: No rashes, palpation reveals normal skin turgor  Psychiatric: Appropriate judgment and insight, oriented to person, place, and time    Assessment & Plan Ralene Ok MD; 11/27/2018 11:31 AM) SYMPTOMATIC CHOLELITHIASIS (K80.20) Impression: 73 year old male with a history of hypertension, SVT, hyperlipidemia, gout, with symptomatic cholelithiasis  1. We will proceed to the operating room for a laparoscopic cholecystectomy  2. Risks and benefits were discussed with the patient to generally include, but not limited to: infection, bleeding, possible need for post op ERCP, damage to the bile ducts, bile leak, and possible need for further  surgery. Alternatives were offered and described. All questions were answered and the patient voiced understanding of the procedure and wishes to proceed at this point with a laparoscopic cholecystectomy

## 2018-12-31 DIAGNOSIS — I10 Essential (primary) hypertension: Secondary | ICD-10-CM | POA: Diagnosis not present

## 2018-12-31 DIAGNOSIS — Z8546 Personal history of malignant neoplasm of prostate: Secondary | ICD-10-CM | POA: Diagnosis not present

## 2018-12-31 DIAGNOSIS — J45909 Unspecified asthma, uncomplicated: Secondary | ICD-10-CM | POA: Diagnosis not present

## 2018-12-31 DIAGNOSIS — Z7982 Long term (current) use of aspirin: Secondary | ICD-10-CM | POA: Diagnosis not present

## 2018-12-31 DIAGNOSIS — M109 Gout, unspecified: Secondary | ICD-10-CM | POA: Diagnosis not present

## 2018-12-31 DIAGNOSIS — K219 Gastro-esophageal reflux disease without esophagitis: Secondary | ICD-10-CM | POA: Diagnosis not present

## 2018-12-31 DIAGNOSIS — E785 Hyperlipidemia, unspecified: Secondary | ICD-10-CM | POA: Diagnosis not present

## 2018-12-31 DIAGNOSIS — I471 Supraventricular tachycardia: Secondary | ICD-10-CM | POA: Diagnosis not present

## 2018-12-31 DIAGNOSIS — E669 Obesity, unspecified: Secondary | ICD-10-CM | POA: Diagnosis not present

## 2018-12-31 DIAGNOSIS — T7840XD Allergy, unspecified, subsequent encounter: Secondary | ICD-10-CM | POA: Diagnosis not present

## 2019-01-27 ENCOUNTER — Telehealth: Payer: Self-pay

## 2019-01-27 NOTE — Telephone Encounter (Signed)
Called pt to offer him a sooner appt with Dr Smith. He is scheduled in Aug and we have some July openings.  No answer...LM 

## 2019-02-05 NOTE — Pre-Procedure Instructions (Signed)
Jorge Morrison  02/05/2019      [facility] [address] [place], [place] [address] [place] [address] Phone: [phone] Fax: [phone]    Your procedure is scheduled on Tues., February 10, 2019 from 7:30aM-8:45AM  Report to [facility] Entrance "A" at 5:30AM  Call this number if you have problems the morning of surgery:  [phone]   Remember:  Do not eat or drink after midnight on July 20th    Take these medicines the morning of surgery with A SIP OF WATER: Diltiazem (CARDIZEM CD) Levothyroxine (SYNTHROID)  Omeprazole (PRILOSEC)  If needed: Cetirizine (ZYRTEC) Colchicine  Epinephrine Fluticasone (FLONASE) Eye Drops Albuterol Inhaler-bring with you the day of surgery  Follow your surgeon's instructions on when to stop Aspirin.  If no instructions were given by your surgeon then you will need to call the office to get those instructions.    As of today, stop taking all Aspirin (unless instructed by your doctor) and Other Aspirin containing products, Vitamins, Fish oils, and Herbal medications. Also stop all NSAIDS i.e. Advil, Ibuprofen, Motrin, Aleve, Anaprox, Naproxen, BC, Goody Powders, and all Supplements.   Special instructions:  [place]- Preparing For Surgery  Before surgery, you can play an important role. Because skin is not sterile, your skin needs to be as free of germs as possible. You can reduce the number of germs on your skin by washing with CHG (chlorahexidine gluconate) Soap before surgery.  CHG is an antiseptic cleaner which kills germs and bonds with the skin to continue killing germs even after washing.    Please do not use if you have an allergy to CHG or antibacterial soaps. If your skin becomes reddened/irritated stop using the CHG.  Do not shave (including legs and underarms) for at least 48 hours prior to first CHG shower. It is OK to shave your face.  Please follow these instructions  carefully.   1. Shower the NIGHT BEFORE SURGERY and the MORNING OF SURGERY with CHG.   2. If you chose to wash your hair, wash your hair first as usual with your normal shampoo.  3. After you shampoo, rinse your hair and body thoroughly to remove the shampoo.  4. Use CHG as you would any other liquid soap. You can apply CHG directly to the skin and wash gently with a scrungie or a clean washcloth.   5. Apply the CHG Soap to your body ONLY FROM THE NECK DOWN.  Do not use on open wounds or open sores. Avoid contact with your eyes, ears, mouth and genitals (private parts). Wash Face and genitals (private parts)  with your normal soap.  6. Wash thoroughly, paying special attention to the area where your surgery will be performed.  7. Thoroughly rinse your body with warm water from the neck down.  8. DO NOT shower/wash with your normal soap after using and rinsing off the CHG Soap.  9. Pat yourself dry with a CLEAN TOWEL.  10. Wear CLEAN PAJAMAS to bed the night before surgery, wear comfortable clothes the morning of surgery  11. Place CLEAN SHEETS on your bed the night of your first shower and DO NOT SLEEP WITH PETS.   Day of Surgery: Remember to brush your teeth WITH YOUR REGULAR TOOTHPASTE.  Do not wear jewelry.  Do not wear lotions, powders, colognes, or deodorant.  Do not shave 48 hours prior to surgery.  Men may shave face.  Do  not bring valuables to the hospital.  [facility] is not responsible for any belongings or valuables.  Contacts, dentures or bridgework may not be worn into surgery.    For patients admitted to the hospital, discharge time will be determined by your treatment team.  Patients discharged the day of surgery will not be allowed to drive home.   Please wear clean clothes to the hospital/surgery center.    Please read over the following fact sheets that you were given. Pain Booklet, Coughing and Deep Breathing and Surgical Site Infection  Prevention

## 2019-02-06 ENCOUNTER — Encounter (HOSPITAL_COMMUNITY)
Admission: RE | Admit: 2019-02-06 | Discharge: 2019-02-06 | Disposition: A | Discharge status: Home / Self Care | Payer: Medicare HMO | Source: Ambulatory Visit | Attending: General Surgery | Admitting: General Surgery

## 2019-02-06 ENCOUNTER — Other Ambulatory Visit: Payer: Self-pay

## 2019-02-06 ENCOUNTER — Encounter (HOSPITAL_COMMUNITY): Payer: Self-pay

## 2019-02-06 ENCOUNTER — Other Ambulatory Visit (HOSPITAL_COMMUNITY)
Admission: RE | Admit: 2019-02-06 | Discharge: 2019-02-06 | Disposition: A | Discharge status: Home / Self Care | Payer: Medicare HMO | Source: Ambulatory Visit | Attending: General Surgery | Admitting: General Surgery

## 2019-02-06 DIAGNOSIS — Z01818 Encounter for other preprocedural examination: Secondary | ICD-10-CM | POA: Insufficient documentation

## 2019-02-06 DIAGNOSIS — Z1159 Encounter for screening for other viral diseases: Secondary | ICD-10-CM | POA: Insufficient documentation

## 2019-02-06 HISTORY — DX: Cardiac arrhythmia, unspecified: I49.9

## 2019-02-06 LAB — COMPREHENSIVE METABOLIC PANEL
ALT: 24 U/L (ref 0–44)
AST: 20 U/L (ref 15–41)
Albumin: 4 g/dL (ref 3.5–5.0)
Alkaline Phosphatase: 68 U/L (ref 38–126)
Anion gap: 9 (ref 5–15)
BUN: 18 mg/dL (ref 8–23)
CO2: 26 mmol/L (ref 22–32)
Calcium: 9.6 mg/dL (ref 8.9–10.3)
Chloride: 105 mmol/L (ref 98–111)
Creatinine, Ser: 1.1 mg/dL (ref 0.61–1.24)
GFR calc Af Amer: >60 mL/min (ref >60)
GFR calc non Af Amer: >60 mL/min (ref >60)
Glucose, Bld: 113 mg/dL — ABNORMAL HIGH (ref 70–99)
Potassium: 3.9 mmol/L (ref 3.5–5.1)
Sodium: 140 mmol/L (ref 135–145)
Total Bilirubin: 0.8 mg/dL (ref 0.3–1.2)
Total Protein: 6.6 g/dL (ref 6.5–8.1)

## 2019-02-06 LAB — CBC
HCT: 44.5 % (ref 39.0–52.0)
Hemoglobin: 14.9 g/dL (ref 13.0–17.0)
MCH: 30.9 pg (ref 26.0–34.0)
MCHC: 33.5 g/dL (ref 30.0–36.0)
MCV: 92.3 fL (ref 80.0–100.0)
Platelets: 132 10*3/uL — ABNORMAL LOW (ref 150–400)
RBC: 4.82 MIL/uL (ref 4.22–5.81)
RDW: 13.4 % (ref 11.5–15.5)
WBC: 4.1 10*3/uL (ref 4.0–10.5)
nRBC: 0 % (ref 0.0–0.2)

## 2019-02-06 LAB — SARS CORONAVIRUS 2 (TAT 6-24 HRS): SARS Coronavirus 2: NEGATIVE

## 2019-02-06 NOTE — Progress Notes (Addendum)
PCP - Yaakov Guthrie, MD Cardiologist - Daneen Schick  Chest x-ray - 1-view 09/09/2018 EKG - 02/06/2019 at PAT appt  Stress Test - denies ECHO - 09/10/2018  Cardiac Cath - denies  Sleep Study - denies  CPAP - n/a  Fasting Blood Sugar - n/a Checks Blood Sugar _____ times a day-n/a  Blood Thinner Instructions: n/a Aspirin Instructions: Aspirin 81 mg  Anesthesia review: Yes-heart history patient worked up for SVT 11/2018, low platlets  Patient denies shortness of breath, fever, cough and chest pain at PAT appointment  Patient verbalized understanding of instructions that were given to them at the PAT appointment. Patient was also instructed that they will need to review over the PAT instructions again at home before surgery.   Coronavirus Screening  Have you experienced the following symptoms:  Cough yes/no: No Fever (>100.24F)  yes/no: No Runny nose yes/no: No Sore throat yes/no: No Difficulty breathing/shortness of breath  yes/no: No  Have you or a family member traveled in the last 14 days and where? yes/no: No   If the patient indicates "YES" to the above questions, their PAT will be rescheduled to limit the exposure to others and, the surgeon will be notified. THE PATIENT WILL NEED TO BE ASYMPTOMATIC FOR 14 DAYS.   If the patient is not experiencing any of these symptoms, the PAT nurse will instruct them to NOT bring anyone with them to their appointment since they may have these symptoms or traveled as well.   Please remind your patients and families that hospital visitation restrictions are in effect and the importance of the restrictions.

## 2019-02-09 NOTE — Anesthesia Preprocedure Evaluation (Addendum)
Anesthesia Evaluation  Patient identified by MRN, date of birth, ID band Patient awake    Reviewed: Allergy & Precautions, NPO status , Patient's Chart, lab work & pertinent test results  History of Anesthesia Complications Negative for: history of anesthetic complications  Airway Mallampati: II  TM Distance: >3 FB Neck ROM: Full    Dental  (+) Dental Advisory Given, Missing   Pulmonary COPD,  COPD inhaler, former smoker,  SARS coronavirus NEG   breath sounds clear to auscultation       Cardiovascular hypertension, Pt. on medications (-) angina+ dysrhythmias Supra Ventricular Tachycardia  Rhythm:Regular Rate:Normal  08/2018 ECHO: EF 60-65%, valves OK 08/2018 Stress: no ischemia at rest or stress, EF 45-54%   Neuro/Psych negative neurological ROS     GI/Hepatic Neg liver ROS, GERD  Controlled and Medicated,Symptomatic gallstones   Endo/Other  Hypothyroidism Morbid obesity  Renal/GU negative Renal ROS     Musculoskeletal   Abdominal (+) + obese,   Peds  Hematology negative hematology ROS (+)   Anesthesia Other Findings   Reproductive/Obstetrics                         Anesthesia Physical Anesthesia Plan  ASA: III  Anesthesia Plan: General   Post-op Pain Management:    Induction: Intravenous  PONV Risk Score and Plan: 2 and Ondansetron, Dexamethasone and Treatment may vary due to age or medical condition  Airway Management Planned: Oral ETT  Additional Equipment:   Intra-op Plan:   Post-operative Plan: Extubation in OR  Informed Consent: I have reviewed the patients History and Physical, chart, labs and discussed the procedure including the risks, benefits and alternatives for the proposed anesthesia with the patient or authorized representative who has indicated his/her understanding and acceptance.     Dental advisory given  Plan Discussed with: CRNA and Surgeon  Anesthesia  Plan Comments: (Follows with cardiology for AVNRT. He was hospitalized in February for chest pain 2/2 episode of AVNRT. Cardiac work-up while hospitalized was negative for evidence of symptomatic coronary disease.  Low risk nuclear study and normal LV function by echo. He was cleared for surgery by Dr. Tamala Julian on 11/24/18: "The patient is cleared for upcoming cholecystectomy by Dr. Rosendo Gros based upon the work-up done in February when he presented with SVT.  No further work-up is needed."  Event monitor 09/24/18:  Basic underlying rhythm is normal sinus rhythm.  No significant arrhythmias are noted.  No complaints identified.  Heart rate range during. Of monitoring is 42 to 119 bpm.  Nuclear stress 09/11/18:  Blood pressure demonstrated a hypertensive response to exercise.  There was no ST segment deviation noted during stress.  No T wave inversion was noted during stress.  The study is normal.  This is a low risk study.  The left ventricular ejection fraction is mildly decreased (45-54%).  TTE 09/10/18: 1. The left ventricle has normal systolic function with an ejection fraction of 60-65%. The cavity size was normal. There is moderately increased left ventricular wall thickness. Left ventricular diastolic parameters were normal.  2. The right ventricle has normal systolic function. The cavity was normal. There is no increase in right ventricular wall thickness.  3. The mitral valve is normal in structure. Mild thickening of the mitral valve leaflet. Mild calcification of the mitral valve leaflet. There is mild mitral annular calcification present.  4. The tricuspid valve is normal in structure.  5. The aortic valve is tricuspid Mild thickening of the  aortic valve Mild calcification of the aortic valve.  6. The pulmonic valve was normal in structure.  7. There is mild dilatation of the aortic root measuring 42 mm.)      Anesthesia Quick Evaluation

## 2019-02-09 NOTE — H&P (Signed)
History of Present Illness  The patient is a 73 year old male who presents for evaluation of gall stones. Referred by: Dr. Leighton Ruff Chief Complaint: Abdominal pain  Patient is a 73 year old male with a history of SVT, hypertension, hyperlipidemia, gout, who comes in with a 6 month history of epigastric abdominal pain. Patient was recently worked up secondary to SVT. He states he began with epigastric abdominal pain, shortness of breath. Patient was seen and worked up in the ER and had a CT scan under control. Patient follow-up with Dr. Tamala Julian for his SVT and placed on diltiazem.  Patient has had previous episodes of epigastric abdominal pain as well as right upper quadrant abdominal pain or normal physical exams. Patient underwent ultrasound which I did review personally which revealed multiple gallstones. Patient had LFTs which were within normal limits.  Patient is fairly active, cycles, and lift weights.   Past Surgical History  Appendectomy  Prostate Surgery - Removal  Vasectomy   Allergies ( No Known Drug Allergies [11/27/2018]: Allergies Reconciled   Medication History Allopurinol (300MG Tablet, Oral) Active. Edex (10MCG Kit, Intracavernosal) Active. Aspirin (81MG Tablet, Oral) Active. Atorvastatin Calcium (40MG Tablet, Oral) Active. Cetirizine HCl (10MG Tablet, Oral) Active. Colchicine (0.6MG Tablet, Oral) Active. EPINEPHrine (0.3MG/0.3ML Soln Auto-inj, Injection) Active. Levothyroxine Sodium (175MCG Tablet, Oral) Active. Losartan Potassium (100MG Tablet, Oral) Active. Cartia XT (240MG Capsule ER 24HR, Oral) Active. Diltiazem HCl CR (240MG/24HR Capsule ER 24HR, Oral) Active. Medications Reconciled  Social History  Alcohol use  Moderate alcohol use. Caffeine use  Tea. Tobacco use  Former smoker.  Family History  Heart Disease  Brother, Family Members In [place], Mother. Heart disease in male family member before age 11   Hypertension  Brother, Mother.   Thyroid problems  Cholelithiasis  Gastroesophageal Reflux Disease  Hemorrhoids  High blood pressure  Hypercholesterolemia  Prostate Cancer  Thyroid Disease     Review of Systems General Not Present- Appetite Loss, Chills, Fatigue, Fever, Night Sweats, Weight Gain and Weight Loss. HEENT Present- Hearing Loss, Ringing in the Ears, Seasonal Allergies, Sinus Pain and Wears glasses/contact lenses. Not Present- Earache, Hoarseness, Nose Bleed, Oral Ulcers, Sore Throat, Visual Disturbances and Yellow Eyes. Respiratory Not Present- Bloody sputum, Chronic Cough, Difficulty Breathing, Snoring and Wheezing. Breast Not Present- Breast Mass, Breast Pain, Nipple Discharge and Skin Changes. Cardiovascular Present- Leg Cramps. Not Present- Chest Pain, Difficulty Breathing Lying Down, Palpitations, Rapid Heart Rate, Shortness of Breath and Swelling of Extremities. Gastrointestinal Present- Abdominal Pain, Constipation, Excessive gas, Hemorrhoids and Indigestion. Not Present- Bloating, Bloody Stool, Change in Bowel Habits, Chronic diarrhea, Difficulty Swallowing, Gets full quickly at meals, Nausea, Rectal Pain and Vomiting. Male Genitourinary Present- Impotence and Urine Leakage. Not Present- Blood in Urine, Change in Urinary Stream, Frequency, Nocturia, Painful Urination and Urgency. Musculoskeletal Present- Joint Pain and Joint Stiffness. Not Present- Back Pain, Muscle Pain, Muscle Weakness and Swelling of Extremities. Neurological Not Present- Decreased Memory, Fainting, Headaches, Numbness, Seizures, Tingling, Tremor, Trouble walking and Weakness. Psychiatric Not Present- Anxiety, Bipolar, Change in Sleep Pattern, Depression, Fearful and Frequent crying. Endocrine Not Present- Cold Intolerance, Excessive Hunger, Hair Changes, Heat Intolerance, Hot flashes and New Diabetes. Hematology Present- Gland problems. Not Present- Blood Thinners, Easy Bruising,  Excessive bleeding, HIV and Persistent Infections. All other systems negative   Physical Exam  The physical exam findings are as follows: Note:Constitutional: No acute distress, conversant, appears stated age  Eyes: Anicteric sclerae, moist conjunctiva, no lid lag  Neck: No thyromegaly, trachea midline, no cervical lymphadenopathy  Lungs: Clear to auscultation biilaterally, normal respiratory effot  Cardiovascular: regular rate & rhythm, no murmurs, no peripheal edema, pedal pulses 2+  GI: Soft, no masses or hepatosplenomegaly, tender to palpation right upper quadrant  MSK: Normal gait, no clubbing cyanosis, edema  Skin: No rashes, palpation reveals normal skin turgor  Psychiatric: Appropriate judgment and insight, oriented to person, place, and time    Assessment & Plan  SYMPTOMATIC CHOLELITHIASIS (K80.20) Impression: 73 year old male with a history of hypertension, SVT, hyperlipidemia, gout, with symptomatic cholelithiasis  1. We will proceed to the operating room for a laparoscopic cholecystectomy  2. Risks and benefits were discussed with the patient to generally include, but not limited to: infection, bleeding, possible need for post op ERCP, damage to the bile ducts, bile leak, and possible need for further surgery. Alternatives were offered and described. All questions were answered and the patient voiced understanding of the procedure and wishes to proceed at this point with a laparoscopic cholecystectomy

## 2019-02-10 ENCOUNTER — Encounter (HOSPITAL_COMMUNITY)
Admission: RE | Disposition: A | Discharge status: Home / Self Care | Payer: Self-pay | Source: Ambulatory Visit | Attending: General Surgery

## 2019-02-10 ENCOUNTER — Other Ambulatory Visit: Payer: Self-pay

## 2019-02-10 ENCOUNTER — Encounter (HOSPITAL_COMMUNITY): Payer: Self-pay | Admitting: General Practice

## 2019-02-10 ENCOUNTER — Ambulatory Visit (HOSPITAL_COMMUNITY): Payer: Medicare HMO | Admitting: Vascular Surgery

## 2019-02-10 ENCOUNTER — Ambulatory Visit (HOSPITAL_COMMUNITY)
Admission: RE | Admit: 2019-02-10 | Discharge: 2019-02-10 | Disposition: A | Discharge status: Home / Self Care | Payer: Medicare HMO | Source: Ambulatory Visit | Attending: General Surgery | Admitting: General Surgery

## 2019-02-10 ENCOUNTER — Ambulatory Visit (HOSPITAL_COMMUNITY): Payer: Medicare HMO | Admitting: Anesthesiology

## 2019-02-10 DIAGNOSIS — J449 Chronic obstructive pulmonary disease, unspecified: Secondary | ICD-10-CM | POA: Insufficient documentation

## 2019-02-10 DIAGNOSIS — Z7982 Long term (current) use of aspirin: Secondary | ICD-10-CM | POA: Insufficient documentation

## 2019-02-10 DIAGNOSIS — Z79899 Other long term (current) drug therapy: Secondary | ICD-10-CM | POA: Diagnosis not present

## 2019-02-10 DIAGNOSIS — M109 Gout, unspecified: Secondary | ICD-10-CM | POA: Insufficient documentation

## 2019-02-10 DIAGNOSIS — Z87891 Personal history of nicotine dependence: Secondary | ICD-10-CM | POA: Insufficient documentation

## 2019-02-10 DIAGNOSIS — Z8546 Personal history of malignant neoplasm of prostate: Secondary | ICD-10-CM | POA: Insufficient documentation

## 2019-02-10 DIAGNOSIS — I10 Essential (primary) hypertension: Secondary | ICD-10-CM | POA: Diagnosis not present

## 2019-02-10 DIAGNOSIS — Z6832 Body mass index (BMI) 32.0-32.9, adult: Secondary | ICD-10-CM | POA: Insufficient documentation

## 2019-02-10 DIAGNOSIS — K8020 Calculus of gallbladder without cholecystitis without obstruction: Secondary | ICD-10-CM | POA: Diagnosis not present

## 2019-02-10 DIAGNOSIS — E039 Hypothyroidism, unspecified: Secondary | ICD-10-CM | POA: Insufficient documentation

## 2019-02-10 DIAGNOSIS — E7800 Pure hypercholesterolemia, unspecified: Secondary | ICD-10-CM | POA: Diagnosis not present

## 2019-02-10 DIAGNOSIS — E785 Hyperlipidemia, unspecified: Secondary | ICD-10-CM | POA: Insufficient documentation

## 2019-02-10 DIAGNOSIS — Z1159 Encounter for screening for other viral diseases: Secondary | ICD-10-CM | POA: Diagnosis not present

## 2019-02-10 DIAGNOSIS — K219 Gastro-esophageal reflux disease without esophagitis: Secondary | ICD-10-CM | POA: Diagnosis not present

## 2019-02-10 DIAGNOSIS — K8010 Calculus of gallbladder with chronic cholecystitis without obstruction: Secondary | ICD-10-CM | POA: Insufficient documentation

## 2019-02-10 DIAGNOSIS — Z79890 Hormone replacement therapy (postmenopausal): Secondary | ICD-10-CM | POA: Diagnosis not present

## 2019-02-10 HISTORY — PX: CHOLECYSTECTOMY: SHX55

## 2019-02-10 SURGERY — LAPAROSCOPIC CHOLECYSTECTOMY
Anesthesia: General | Site: Abdomen

## 2019-02-10 MED ORDER — FENTANYL CITRATE (PF) 250 MCG/5ML IJ SOLN
INTRAMUSCULAR | Status: AC
  Filled 2019-02-10: qty 5

## 2019-02-10 MED ORDER — ONDANSETRON HCL 4 MG/2ML IJ SOLN
INTRAMUSCULAR | Status: AC
  Filled 2019-02-10: qty 2

## 2019-02-10 MED ORDER — MIDAZOLAM HCL 2 MG/2ML IJ SOLN: 0.5000 mg | Freq: Once | INTRAMUSCULAR | Status: DC | PRN

## 2019-02-10 MED ORDER — CEFAZOLIN SODIUM-DEXTROSE 2-4 GM/100ML-% IV SOLN
INTRAVENOUS | Status: AC
  Filled 2019-02-10: qty 100

## 2019-02-10 MED ORDER — 0.9 % SODIUM CHLORIDE (POUR BTL) OPTIME
TOPICAL | Status: DC | PRN
  Administered 2019-02-10: 1000 mL

## 2019-02-10 MED ORDER — HYDROMORPHONE HCL 1 MG/ML IJ SOLN: 0.2500 mg | INTRAMUSCULAR | Status: DC | PRN

## 2019-02-10 MED ORDER — ACETAMINOPHEN 500 MG PO TABS
1000.0000 mg | ORAL_TABLET | ORAL | Status: AC
  Administered 2019-02-10: 1000 mg via ORAL

## 2019-02-10 MED ORDER — ONDANSETRON HCL 4 MG/2ML IJ SOLN
INTRAMUSCULAR | Status: DC | PRN
  Administered 2019-02-10: 4 mg via INTRAVENOUS

## 2019-02-10 MED ORDER — CHLORHEXIDINE GLUCONATE CLOTH 2 % EX PADS: 6.0000 | MEDICATED_PAD | Freq: Once | CUTANEOUS | Status: DC

## 2019-02-10 MED ORDER — ACETAMINOPHEN 500 MG PO TABS
ORAL_TABLET | ORAL | Status: AC
  Administered 2019-02-10: 1000 mg via ORAL
  Filled 2019-02-10: qty 2

## 2019-02-10 MED ORDER — BUPIVACAINE HCL 0.25 % IJ SOLN
INTRAMUSCULAR | Status: DC | PRN
  Administered 2019-02-10: 5 mL

## 2019-02-10 MED ORDER — PROMETHAZINE HCL 25 MG/ML IJ SOLN: 6.2500 mg | INTRAMUSCULAR | Status: DC | PRN

## 2019-02-10 MED ORDER — PHENYLEPHRINE 40 MCG/ML (10ML) SYRINGE FOR IV PUSH (FOR BLOOD PRESSURE SUPPORT)
PREFILLED_SYRINGE | INTRAVENOUS | Status: AC
  Filled 2019-02-10: qty 10

## 2019-02-10 MED ORDER — CELECOXIB 200 MG PO CAPS
ORAL_CAPSULE | ORAL | Status: AC
  Administered 2019-02-10: 200 mg via ORAL
  Filled 2019-02-10: qty 1

## 2019-02-10 MED ORDER — PROPOFOL 10 MG/ML IV BOLUS
INTRAVENOUS | Status: DC | PRN
  Administered 2019-02-10: 140 mg via INTRAVENOUS

## 2019-02-10 MED ORDER — BUPIVACAINE HCL (PF) 0.25 % IJ SOLN
INTRAMUSCULAR | Status: AC
  Filled 2019-02-10: qty 30

## 2019-02-10 MED ORDER — PROPOFOL 10 MG/ML IV BOLUS
INTRAVENOUS | Status: AC
  Filled 2019-02-10: qty 60

## 2019-02-10 MED ORDER — DEXAMETHASONE SODIUM PHOSPHATE 10 MG/ML IJ SOLN
INTRAMUSCULAR | Status: AC
  Filled 2019-02-10: qty 1

## 2019-02-10 MED ORDER — GLYCOPYRROLATE 0.2 MG/ML IJ SOLN
INTRAMUSCULAR | Status: DC | PRN
  Administered 2019-02-10: 0.2 mg via INTRAVENOUS

## 2019-02-10 MED ORDER — SODIUM CHLORIDE 0.9 % IR SOLN
Status: DC | PRN
  Administered 2019-02-10: 1000 mL

## 2019-02-10 MED ORDER — LACTATED RINGERS IV SOLN
INTRAVENOUS | Status: DC | PRN
  Administered 2019-02-10: 07:00:00 via INTRAVENOUS

## 2019-02-10 MED ORDER — MEPERIDINE HCL 25 MG/ML IJ SOLN: 6.2500 mg | INTRAMUSCULAR | Status: DC | PRN

## 2019-02-10 MED ORDER — CEFAZOLIN SODIUM-DEXTROSE 2-4 GM/100ML-% IV SOLN
2.0000 g | INTRAVENOUS | Status: AC
  Administered 2019-02-10: 2 g via INTRAVENOUS

## 2019-02-10 MED ORDER — GLYCOPYRROLATE PF 0.2 MG/ML IJ SOSY
PREFILLED_SYRINGE | INTRAMUSCULAR | Status: AC
  Filled 2019-02-10: qty 1

## 2019-02-10 MED ORDER — GABAPENTIN 300 MG PO CAPS
300.0000 mg | ORAL_CAPSULE | ORAL | Status: AC
  Administered 2019-02-10: 300 mg via ORAL

## 2019-02-10 MED ORDER — SUGAMMADEX SODIUM 200 MG/2ML IV SOLN
INTRAVENOUS | Status: DC | PRN
  Administered 2019-02-10: 232.6 mg via INTRAVENOUS

## 2019-02-10 MED ORDER — TRAMADOL HCL 50 MG PO TABS: 50.0000 mg | ORAL_TABLET | Freq: Four times a day (QID) | ORAL | 0 refills | Status: AC | PRN

## 2019-02-10 MED ORDER — ROCURONIUM BROMIDE 50 MG/5ML IV SOSY
PREFILLED_SYRINGE | INTRAVENOUS | Status: DC | PRN
  Administered 2019-02-10: 60 mg via INTRAVENOUS

## 2019-02-10 MED ORDER — DEXAMETHASONE SODIUM PHOSPHATE 10 MG/ML IJ SOLN
INTRAMUSCULAR | Status: DC | PRN
  Administered 2019-02-10: 5 mg via INTRAVENOUS

## 2019-02-10 MED ORDER — GABAPENTIN 300 MG PO CAPS
ORAL_CAPSULE | ORAL | Status: AC
  Administered 2019-02-10: 300 mg via ORAL
  Filled 2019-02-10: qty 1

## 2019-02-10 MED ORDER — ROCURONIUM BROMIDE 10 MG/ML (PF) SYRINGE
PREFILLED_SYRINGE | INTRAVENOUS | Status: AC
  Filled 2019-02-10: qty 10

## 2019-02-10 MED ORDER — EPHEDRINE 5 MG/ML INJ
INTRAVENOUS | Status: AC
  Filled 2019-02-10: qty 10

## 2019-02-10 MED ORDER — PHENYLEPHRINE HCL (PRESSORS) 10 MG/ML IV SOLN
INTRAVENOUS | Status: DC | PRN
  Administered 2019-02-10: 40 ug via INTRAVENOUS

## 2019-02-10 MED ORDER — LIDOCAINE 2% (20 MG/ML) 5 ML SYRINGE
INTRAMUSCULAR | Status: DC | PRN
  Administered 2019-02-10: 60 mg via INTRAVENOUS

## 2019-02-10 MED ORDER — CELECOXIB 200 MG PO CAPS
200.0000 mg | ORAL_CAPSULE | ORAL | Status: AC
  Administered 2019-02-10: 200 mg via ORAL

## 2019-02-10 MED ORDER — LIDOCAINE 2% (20 MG/ML) 5 ML SYRINGE
INTRAMUSCULAR | Status: AC
  Filled 2019-02-10: qty 5

## 2019-02-10 MED ORDER — FENTANYL CITRATE (PF) 250 MCG/5ML IJ SOLN
INTRAMUSCULAR | Status: DC | PRN
  Administered 2019-02-10: 100 ug via INTRAVENOUS
  Administered 2019-02-10: 50 ug via INTRAVENOUS

## 2019-02-10 MED ORDER — EPHEDRINE SULFATE 50 MG/ML IJ SOLN
INTRAMUSCULAR | Status: DC | PRN
  Administered 2019-02-10: 10 mg via INTRAVENOUS

## 2019-02-10 SURGICAL SUPPLY — 38 items
CANISTER SUCT 3000ML PPV (MISCELLANEOUS) ×2 IMPLANT
CHLORAPREP W/TINT 26 (MISCELLANEOUS) ×2 IMPLANT
CLIP VESOLOCK MED LG 6/CT (CLIP) ×3 IMPLANT
COVER SURGICAL LIGHT HANDLE (MISCELLANEOUS) ×2 IMPLANT
COVER TRANSDUCER ULTRASND (DRAPES) ×2 IMPLANT
COVER WAND RF STERILE (DRAPES) ×1 IMPLANT
DEFOGGER SCOPE WARMER CLEARIFY (MISCELLANEOUS) IMPLANT
DERMABOND ADVANCED (GAUZE/BANDAGES/DRESSINGS) ×1
DERMABOND ADVANCED .7 DNX12 (GAUZE/BANDAGES/DRESSINGS) ×1 IMPLANT
ELECT REM PT RETURN 9FT ADLT (ELECTROSURGICAL) ×2
ELECTRODE REM PT RTRN 9FT ADLT (ELECTROSURGICAL) ×1 IMPLANT
GLOVE BIO SURGEON STRL SZ7.5 (GLOVE) ×2 IMPLANT
GOWN STRL REUS W/ TWL LRG LVL3 (GOWN DISPOSABLE) ×2 IMPLANT
GOWN STRL REUS W/ TWL XL LVL3 (GOWN DISPOSABLE) ×1 IMPLANT
GOWN STRL REUS W/TWL LRG LVL3 (GOWN DISPOSABLE) ×2
GOWN STRL REUS W/TWL XL LVL3 (GOWN DISPOSABLE) ×1
GRASPER SUT TROCAR 14GX15 (MISCELLANEOUS) ×2 IMPLANT
KIT BASIN OR (CUSTOM PROCEDURE TRAY) ×2 IMPLANT
KIT TURNOVER KIT B (KITS) ×2 IMPLANT
NDL INSUFFLATION 14GA 120MM (NEEDLE) ×1 IMPLANT
NEEDLE INSUFFLATION 14GA 120MM (NEEDLE) ×2 IMPLANT
NS IRRIG 1000ML POUR BTL (IV SOLUTION) ×2 IMPLANT
PAD ARMBOARD 7.5X6 YLW CONV (MISCELLANEOUS) ×2 IMPLANT
POUCH LAPAROSCOPIC INSTRUMENT (MISCELLANEOUS) ×2 IMPLANT
POUCH RETRIEVAL ECOSAC 10 (ENDOMECHANICALS) IMPLANT
POUCH RETRIEVAL ECOSAC 10MM (ENDOMECHANICALS)
SCISSORS LAP 5X35 DISP (ENDOMECHANICALS) ×2 IMPLANT
SET IRRIG TUBING LAPAROSCOPIC (IRRIGATION / IRRIGATOR) ×2 IMPLANT
SET TUBE SMOKE EVAC HIGH FLOW (TUBING) ×2 IMPLANT
SLEEVE ENDOPATH XCEL 5M (ENDOMECHANICALS) ×2 IMPLANT
SPECIMEN JAR SMALL (MISCELLANEOUS) ×2 IMPLANT
SUT MNCRL AB 4-0 PS2 18 (SUTURE) ×2 IMPLANT
TOWEL GREEN STERILE (TOWEL DISPOSABLE) ×2 IMPLANT
TOWEL GREEN STERILE FF (TOWEL DISPOSABLE) ×2 IMPLANT
TRAY LAPAROSCOPIC MC (CUSTOM PROCEDURE TRAY) ×2 IMPLANT
TROCAR XCEL NON-BLD 11X100MML (ENDOMECHANICALS) ×2 IMPLANT
TROCAR XCEL NON-BLD 5MMX100MML (ENDOMECHANICALS) ×2 IMPLANT
WATER STERILE IRR 1000ML POUR (IV SOLUTION) ×2 IMPLANT

## 2019-02-10 NOTE — Transfer of Care (Signed)
Immediate Anesthesia Transfer of Care Note  Patient: Jorge Morrison  Procedure(s) Performed: LAPAROSCOPIC CHOLECYSTECTOMY (N/A Abdomen)  Patient Location: PACU  Anesthesia Type:General  Level of Consciousness: drowsy and patient cooperative  Airway & Oxygen Therapy: Patient Spontanous Breathing and Patient connected to face mask oxygen  Post-op Assessment: Report given to RN and Post -op Vital signs reviewed and stable  Post vital signs: Reviewed and stable  Last Vitals:  Vitals Value Taken Time  BP 131/72 02/10/19 0836  Temp    Pulse 58 02/10/19 0837  Resp 9 02/10/19 0837  SpO2 98 % 02/10/19 0837  Vitals shown include unvalidated device data.  Last Pain:  Vitals:   02/10/19 0647  TempSrc: Oral  PainSc:       Patients Stated Pain Goal: 2 (18/34/37 3578)  Complications: No apparent anesthesia complications

## 2019-02-10 NOTE — Discharge Instructions (Signed)
General Anesthesia, Adult, Care After °This sheet gives you information about how to care for yourself after your procedure. Your health care provider may also give you more specific instructions. If you have problems or questions, contact your health care provider. °What can I expect after the procedure? °After the procedure, the following side effects are common: °· Pain or discomfort at the IV site. °· Nausea. °· Vomiting. °· Sore throat. °· Trouble concentrating. °· Feeling cold or chills. °· Weak or tired. °· Sleepiness and fatigue. °· Soreness and body aches. These side effects can affect parts of the body that were not involved in surgery. °Follow these instructions at home: ° °For at least 24 hours after the procedure: °· Have a responsible adult stay with you. It is important to have someone help care for you until you are awake and alert. °· Rest as needed. °· Do not: °? Participate in activities in which you could fall or become injured. °? Drive. °? Use heavy machinery. °? Drink alcohol. °? Take sleeping pills or medicines that cause drowsiness. °? Make important decisions or sign legal documents. °? Take care of children on your own. °Eating and drinking °· Follow any instructions from your health care provider about eating or drinking restrictions. °· When you feel hungry, start by eating small amounts of foods that are soft and easy to digest (bland), such as toast. Gradually return to your regular diet. °· Drink enough fluid to keep your urine pale yellow. °· If you vomit, rehydrate by drinking water, juice, or clear broth. °General instructions °· If you have sleep apnea, surgery and certain medicines can increase your risk for breathing problems. Follow instructions from your health care provider about wearing your sleep device: °? Anytime you are sleeping, including during daytime naps. °? While taking prescription pain medicines, sleeping medicines, or medicines that make you drowsy. °· Return to  your normal activities as told by your health care provider. Ask your health care provider what activities are safe for you. °· Take over-the-counter and prescription medicines only as told by your health care provider. °· If you smoke, do not smoke without supervision. °· Keep all follow-up visits as told by your health care provider. This is important. °Contact a health care provider if: °· You have nausea or vomiting that does not get better with medicine. °· You cannot eat or drink without vomiting. °· You have pain that does not get better with medicine. °· You are unable to pass urine. °· You develop a skin rash. °· You have a fever. °· You have redness around your IV site that gets worse. °Get help right away if: °· You have difficulty breathing. °· You have chest pain. °· You have blood in your urine or stool, or you vomit blood. °Summary °· After the procedure, it is common to have a sore throat or nausea. It is also common to feel tired. °· Have a responsible adult stay with you for the first 24 hours after general anesthesia. It is important to have someone help care for you until you are awake and alert. °· When you feel hungry, start by eating small amounts of foods that are soft and easy to digest (bland), such as toast. Gradually return to your regular diet. °· Drink enough fluid to keep your urine pale yellow. °· Return to your normal activities as told by your health care provider. Ask your health care provider what activities are safe for you. °This information is not   intended to replace advice given to you by your health care provider. Make sure you discuss any questions you have with your health care provider. Document Released: 10/15/2000 Document Revised: 07/12/2017 Document Reviewed: 02/22/2017 Elsevier Patient Education  [place] ______[facility] LAPAROSCOPIC SURGERY: POST OP  INSTRUCTIONS Always review your discharge instruction sheet given to you by the facility where your surgery was performed. IF YOU HAVE DISABILITY OR FAMILY LEAVE FORMS, YOU MUST BRING THEM TO THE OFFICE FOR PROCESSING.   DO NOT GIVE THEM TO YOUR DOCTOR.  1. A prescription for pain medication may be given to you upon discharge.  Take your pain medication as prescribed, if needed.  If narcotic pain medicine is not needed, then you may take acetaminophen (Tylenol) or ibuprofen (Advil) as needed. 2. Take your usually prescribed medications unless otherwise directed. 3. If you need a refill on your pain medication, please contact your pharmacy.  They will contact our office to request authorization. Prescriptions will not be filled after 5pm or on week-ends. 4. You should follow a light diet the first few days after arrival home, such as soup and crackers, etc.  Be sure to include lots of fluids daily. 5. Most patients will experience some swelling and bruising in the area of the incisions.  Ice packs will help.  Swelling and bruising can take several days to resolve.  6. It is common to experience some constipation if taking pain medication after surgery.  Increasing fluid intake and taking a stool softener (such as Colace) will usually help or prevent this problem from occurring.  A mild laxative (Milk of Magnesia or Miralax) should be taken according to package instructions if there are no bowel movements after 48 hours. 7. Unless discharge instructions indicate otherwise, you may remove your bandages 24-48 hours after surgery, and you may shower at that time.  You may have steri-strips (small skin tapes) in place directly over the incision.  These strips should be left on the skin for 7-10 days.  If your surgeon used skin glue on the incision, you may shower in 24 hours.  The glue will flake off over the next 2-3 weeks.  Any sutures or staples will be removed at the office during your follow-up  visit. 8. ACTIVITIES:  You may resume regular (light) daily activities beginning the next day--such as daily self-care, walking, climbing stairs--gradually increasing activities as tolerated.  You may have sexual intercourse when it is comfortable.  Refrain from any heavy lifting or straining until approved by your doctor. a. You may drive when you are no longer taking prescription pain medication, you can comfortably wear a seatbelt, and you can safely maneuver your car and apply brakes. b. RETURN TO WORK:  __________________________________________________________ 9. You should see your doctor in the office for a follow-up appointment approximately 2-3 weeks after your surgery.  Make sure that you call for this appointment within a day or two after you arrive home to insure a convenient appointment time. 10. OTHER INSTRUCTIONS: __________________________________________________________________________________________________________________________ __________________________________________________________________________________________________________________________ WHEN TO  CALL YOUR DOCTOR: 1. Fever over 101.0 2. Inability to urinate 3. Continued bleeding from incision. 4. Increased pain, redness, or drainage from the incision. 5. Increasing abdominal pain  The clinic staff is available to answer your questions during regular business hours.  Please dont hesitate to call and ask to speak to one of the nurses for clinical concerns.  If you have a medical emergency, go to the nearest emergency room or call 911.  A surgeon from [facility] is always on call at the hospital. [address] [place], [place], [place]  [address] ? [address], [place],    [address] [phone] ? [phone] ? FAX [phone] Web site: [email or web address]

## 2019-02-10 NOTE — Anesthesia Procedure Notes (Signed)
Procedure Name: Intubation Date/Time: 02/10/2019 7:49 AM Performed by: Kathryne Hitch, CRNA Pre-anesthesia Checklist: Patient identified, Emergency Drugs available, Suction available and Patient being monitored Patient Re-evaluated:Patient Re-evaluated prior to induction Oxygen Delivery Method: Circle system utilized Preoxygenation: Pre-oxygenation with 100% oxygen Induction Type: IV induction Ventilation: Mask ventilation without difficulty Laryngoscope Size: Miller and 2 Grade View: Grade I Tube type: Oral Number of attempts: 1 Airway Equipment and Method: Stylet and Oral airway Placement Confirmation: ETT inserted through vocal cords under direct vision,  positive ETCO2 and breath sounds checked- equal and bilateral Secured at: 23 cm Tube secured with: Tape Dental Injury: Teeth and Oropharynx as per pre-operative assessment

## 2019-02-10 NOTE — Interval H&P Note (Signed)
History and Physical Interval Note:  02/10/2019 7:11 AM  Jorge Morrison  has presented today for surgery, with the diagnosis of GALLSTONES.  The various methods of treatment have been discussed with the patient and family. After consideration of risks, benefits and other options for treatment, the patient has consented to  Procedure(s): LAPAROSCOPIC CHOLECYSTECTOMY (N/A) as a surgical intervention.  The patient's history has been reviewed, patient examined, no change in status, stable for surgery.  I have reviewed the patient's chart and labs.  Questions were answered to the patient's satisfaction.     Ralene Ok

## 2019-02-10 NOTE — Op Note (Signed)
02/10/2019  8:18 AM  PATIENT:  Jorge Morrison  73 y.o. male  PRE-OPERATIVE DIAGNOSIS:  GALLSTONES  POST-OPERATIVE DIAGNOSIS:  GALLSTONES  PROCEDURE:  Procedure(s): LAPAROSCOPIC CHOLECYSTECTOMY (N/A)  SURGEON:  Surgeon(s) and Role:    * Ralene Ok, MD - Primary  ASSISTANTS: Pryor Curia, RN   ANESTHESIA:   local and general  EBL:  minimal   BLOOD ADMINISTERED:none  DRAINS: none   LOCAL MEDICATIONS USED:  BUPIVICAINE   SPECIMEN:  Source of Specimen:  gallbladder  DISPOSITION OF SPECIMEN:  PATHOLOGY  COUNTS:  YES  TOURNIQUET:  * No tourniquets in log *  DICTATION: .Dragon Dictation  EBL: <4RD   Complications: none   Counts: reported as correct x 2   Findings:chronic inflammation of the gallbladder, gallstones  Indications for procedure: Pt is a 42M with RUQ pain and seen to have gallstones.   Details of the procedure: The patient was taken to the operating and placed in the supine position with bilateral SCDs in place. A time out was called and all facts were verified. A pneumoperitoneum was obtained via A Veress needle technique to a pressure of 37mm of mercury. A 27mm trochar was then placed in the right upper quadrant under visualization, and there were no injuries to any abdominal organs. A 11 mm port was then placed in the umbilical region after infiltrating with local anesthesia under direct visualization. A second epigastric port was placed under direct visualization.   The gallbladder was identified and retracted, the peritoneum was then sharply dissected from the gallbladder and this dissection was carried down to Calot's triangle. The cystic duct was identified and dissected circumferentially and seen going into the gallbladder 360.  The cystic artery was dissected away from the surrounding tissues.   The critical angle was obtained.   2 clips were placed proximally one distally and the cystic duct transected. The cystic artery was identified and  2 clips placed proximally and one distally and transected. We then proceeded to remove the gallbladder off the hepatic fossa with Bovie cautery. 3 more clips were placed on a posterior cystic artery and it was cauterized. A retrieval bag was then placed in the abdomen and gallbladder placed in the bag. The hepatic fossa was then reexamined and hemostasis was achieved with Bovie cautery and was excellent at this portion of the case. The subhepatic fossa and perihepatic fossa was then irrigated until the effluent was clear. The specimen bag and specimen were removed from the abdominal cavity.  The 11 mm trocar fascia was reapproximated with the Endo Close #1 Vicryl x1. The pneumoperitoneum was evacuated and all trochars removed under direct visulalization. The skin was then closed with 4-0 Monocryl and the skin dressed with Dermabond. The patient was awaken from general anesthesia and taken to the recovery room in stable condition.    PLAN OF CARE: Discharge to home after PACU  PATIENT DISPOSITION:  PACU - hemodynamically stable.   Delay start of Pharmacological VTE agent (>24hrs) due to surgical blood loss or risk of bleeding: not applicable

## 2019-02-10 NOTE — Anesthesia Postprocedure Evaluation (Signed)
Anesthesia Post Note  Patient: Jorge Morrison  Procedure(s) Performed: LAPAROSCOPIC CHOLECYSTECTOMY (N/A Abdomen)     Patient location during evaluation: PACU Anesthesia Type: General Level of consciousness: awake and alert, oriented and patient cooperative Pain management: pain level controlled Vital Signs Assessment: post-procedure vital signs reviewed and stable Respiratory status: spontaneous breathing, nonlabored ventilation and respiratory function stable Cardiovascular status: blood pressure returned to baseline and stable Postop Assessment: no apparent nausea or vomiting and adequate PO intake Anesthetic complications: no    Last Vitals:  Vitals:   02/10/19 0925 02/10/19 0935  BP: 119/70 123/69  Pulse: (!) 51 (!) 58  Resp: 12 14  Temp: (!) 36.2 C   SpO2: 96% 98%    Last Pain:  Vitals:   02/10/19 0840  TempSrc:   PainSc: 0-No pain                 Shakita Keir,E. Maurice Ramseur

## 2019-02-11 ENCOUNTER — Encounter (HOSPITAL_COMMUNITY): Payer: Self-pay | Admitting: General Surgery

## 2019-02-18 DIAGNOSIS — R69 Illness, unspecified: Secondary | ICD-10-CM | POA: Diagnosis not present

## 2019-03-02 ENCOUNTER — Telehealth: Payer: Self-pay

## 2019-03-02 NOTE — Telephone Encounter (Signed)
Called pt to go over meds for 8/12 appt with Dr Tamala Julian. No answer.

## 2019-03-03 NOTE — Telephone Encounter (Signed)
2nd attempt to reach pt to review meds for visit with Dr. Tamala Julian on 8/12

## 2019-03-03 NOTE — Telephone Encounter (Signed)
LM for pt to call back to review his meds for his VV with Dr Tamala Julian on 8/12. Also, need to change his appt to 9:00.

## 2019-03-03 NOTE — Telephone Encounter (Signed)
Sofie Rower., CMA is speaking with the pt currently at this time.

## 2019-03-03 NOTE — Progress Notes (Signed)
Virtual Visit via Video Note   This visit type was conducted due to national recommendations for restrictions regarding the COVID-19 Pandemic (e.g. social distancing) in an effort to limit this patient's exposure and mitigate transmission in our community.  Due to his co-morbid illnesses, this patient is at least at moderate risk for complications without adequate follow up.  This format is felt to be most appropriate for this patient at this time.  All issues noted in this document were discussed and addressed.  A limited physical exam was performed with this format.  Please refer to the patient's chart for his consent to telehealth for [facility].   Date:  03/04/2019   ID:  Jorge Morrison, DOB 1946-04-10, MRN [id number]  Patient Location: Home Provider Location: Office PCP:  [name], MD  Cardiologist:  No primary care provider on file.  Electrophysiologist:  None   Evaluation Performed:  Follow-Up Visit  Chief Complaint:  PSVT  History of Present Illness:    Jorge Morrison is a 73 y.o. 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  Doing well. Has not felt palpitations or heart racing. Medication adjustments are being well tolerated.  Had recent appendectomy without complications. Headed to [place] for a month long vacation at [facility].   The patient does not have symptoms concerning for COVID-19 infection (fever, chills, cough, or new shortness of breath).    Past Medical History:  Diagnosis Date  . Chest pain 09/09/2018  . Dysrhythmia    treated fr SVT 11/2018, started on medication  . Elevated troponin 09/10/2018  . GERD (gastroesophageal reflux disease)   . Gout 09/10/2018  . High cholesterol   . HLD (hyperlipidemia) 09/10/2018  . Hypertension   . Hypothyroidism 09/10/2018  . Prostate cancer ([place])   . SVT (supraventricular tachycardia) ([place]) 09/10/2018  . Thyroid disease    Past Surgical  History:  Procedure Laterality Date  . APPENDECTOMY    . CHOLECYSTECTOMY N/A 02/10/2019   Procedure: LAPAROSCOPIC CHOLECYSTECTOMY;  Surgeon: Ralene Ok, MD;  Location: [place];  Service: General;  Laterality: N/A;  . PROSTATECTOMY    . TONSILLECTOMY       Current Meds  Medication Sig  . albuterol (PROVENTIL HFA) 108 (90 BASE) MCG/ACT inhaler Inhale 2 puffs into the lungs every 6 (six) hours as needed for wheezing or shortness of breath.  . allopurinol (ZYLOPRIM) 300 MG tablet Take 300 mg by mouth every evening.   Marland Kitchen alprostadil (EDEX) 10 MCG injection 10 mcg by Intracavitary route as needed for erectile dysfunction.   Marland Kitchen aspirin EC 81 MG tablet Take 81 mg by mouth 2 (two) times a day.  Marland Kitchen atorvastatin (LIPITOR) 40 MG tablet Take 40 mg by mouth daily at 6 PM.   . cetirizine (ZYRTEC) 10 MG tablet Take 10 mg by mouth daily as needed for allergies.  Marland Kitchen colchicine 0.6 MG tablet Take 0.6 mg by mouth daily as needed (for gout flare up).   . diltiazem (CARDIZEM CD) 240 MG 24 hr capsule Take 1 capsule (240 mg total) by mouth daily.  Marland Kitchen EPINEPHrine (EPIPEN 2-PAK) 0.3 mg/0.3 mL IJ SOAJ injection Inject 0.3 mg into the muscle once as needed for anaphylaxis.   . fluticasone (FLONASE) 50 MCG/ACT nasal spray Place 1 spray into both nostrils 2 (two) times daily as needed for allergies or rhinitis.  Marland Kitchen GLUCOSAMINE-CHONDROITIN-MSM PO Take 1 tablet by mouth daily.   . Glycerin-Hypromellose-PEG 400 ([employer] DRY EYE  RELIEF OP) Place 2 drops into both eyes daily as needed (for dry eyes).  . hydrochlorothiazide (HYDRODIURIL) 12.5 MG tablet Take 1 tablet (12.5 mg total) by mouth daily.  Marland Kitchen levothyroxine (SYNTHROID) 150 MCG tablet Take 150 mcg by mouth See admin instructions. Take 150 mcg by mouth daily on Sunday, Tuesday, Thursday and Saturday.  . levothyroxine (SYNTHROID) 175 MCG tablet Take 175 mcg by mouth every Monday, Wednesday, and Friday.   . losartan (COZAAR) 100 MG tablet Take 100 mg by mouth daily.  . Omega-3  Krill Oil 500 MG CAPS Take 500 mg by mouth every evening.   Marland Kitchen omeprazole (PRILOSEC) 20 MG capsule Take 20 mg by mouth daily.  Vladimir Faster Glycol-Propyl Glycol (SYSTANE ULTRA) 0.4-0.3 % SOLN Place 1 drop into both eyes daily as needed (for dry eyes).  . Probiotic Product (PROBIOTIC PO) Take 1 capsule by mouth daily.  . sodium chloride (OCEAN NASAL SPRAY) 0.65 % nasal spray Place 1 spray into the nose daily as needed for congestion.  . traMADol (ULTRAM) 50 MG tablet Take 1 tablet (50 mg total) by mouth every 6 (six) hours as needed.  . Zinc 50 MG CAPS Take 50 mg by mouth daily as needed (for cold).      Allergies:   Other   Social History   Tobacco Use  . Smoking status: Former Research scientist (life sciences)  . Smokeless tobacco: Never Used  Substance Use Topics  . Alcohol use: Yes    Comment: daily  . Drug use: No     Family Hx: The patient's family history includes CAD in his brother and mother.  ROS:   Please see the history of present illness.    No neurological complaints, dyspnea, chest pain or swelling. All other systems reviewed and are negative.   Prior CV studies:   The following studies were reviewed today:  Echocardiography 08/2018: IMPRESSIONS    1. The left ventricle has normal systolic function with an ejection fraction of 60-65%. The cavity size was normal. There is moderately increased left ventricular wall thickness. Left ventricular diastolic parameters were normal.  2. The right ventricle has normal systolic function. The cavity was normal. There is no increase in right ventricular wall thickness.  3. The mitral valve is normal in structure. Mild thickening of the mitral valve leaflet. Mild calcification of the mitral valve leaflet. There is mild mitral annular calcification present.  4. The tricuspid valve is normal in structure.  5. The aortic valve is tricuspid Mild thickening of the aortic valve Mild calcification of the aortic valve.  6. The pulmonic valve was normal in  structure.  7. There is mild dilatation of the aortic root measuring 42 mm.  Labs/Other Tests and Data Reviewed:    EKG:  No ECG reviewed.  Recent Labs: 09/09/2018: TSH 7.110 02/06/2019: ALT 24; BUN 18; Creatinine, Ser 1.10; Hemoglobin 14.9; Platelets 132; Potassium 3.9; Sodium 140   Recent Lipid Panel No results found for: CHOL, TRIG, HDL, CHOLHDL, LDLCALC, LDLDIRECT  Wt Readings from Last 3 Encounters:  03/04/19 247 lb (112 kg)  02/10/19 256 lb 4.8 oz (116.3 kg)  02/06/19 256 lb 4.8 oz (116.3 kg)     Objective:    Vital Signs:  BP 135/78   Pulse 69   Ht 6\' 3"  (1.905 m)   Wt 247 lb (112 kg)   BMI 30.87 kg/m    VITAL SIGNS:  reviewed GEN:  no acute distress RESPIRATORY:  normal respiratory effort, symmetric expansion CARDIOVASCULAR:  no peripheral  edema  ASSESSMENT & PLAN:    1. SVT (supraventricular tachycardia) ([place])   2. Essential hypertension   3. Mixed hyperlipidemia   4. CAD in native artery   5. Educated About Covid-19 Virus Infection    PLAN:   1. No recurrence on combined beta blocker and diltiazem. 2. Target BP is being achieved. 3. LDL target < 70. 4. Low risk nuclear study within past 4 months. 5. WWW discussed relative to COVID prevention. 6.   COVID-19 Education: The signs and symptoms of COVID-19 were discussed with the patient and how to seek care for testing (follow up with PCP or arrange E-visit).  The importance of social distancing was discussed today.  Time:   Today, I have spent 15 minutes with the patient with telehealth technology discussing the above problems.     Medication Adjustments/Labs and Tests Ordered: Current medicines are reviewed at length with the patient today.  Concerns regarding medicines are outlined above.   Tests Ordered: No orders of the defined types were placed in this encounter.   Medication Changes: No orders of the defined types were placed in this encounter.   Follow Up:  In Person in 65 month(s)   Signed, Sinclair Grooms, MD  03/04/2019 10:19 AM    [place]

## 2019-03-03 NOTE — Telephone Encounter (Signed)
Follow up   Patient is returning call. Patient states that he will be available after 12:00 pm today.

## 2019-03-04 ENCOUNTER — Encounter

## 2019-03-04 ENCOUNTER — Telehealth (INDEPENDENT_AMBULATORY_CARE_PROVIDER_SITE_OTHER): Payer: Medicare HMO | Admitting: Interventional Cardiology

## 2019-03-04 ENCOUNTER — Encounter: Payer: Self-pay | Admitting: Interventional Cardiology

## 2019-03-04 ENCOUNTER — Other Ambulatory Visit: Payer: Self-pay

## 2019-03-04 VITALS — BP 135/78 | HR 69 | Ht 75.0 in | Wt 247.0 lb

## 2019-03-04 DIAGNOSIS — I471 Supraventricular tachycardia, unspecified: Secondary | ICD-10-CM

## 2019-03-04 DIAGNOSIS — E782 Mixed hyperlipidemia: Secondary | ICD-10-CM

## 2019-03-04 DIAGNOSIS — I2510 Atherosclerotic heart disease of native coronary artery without angina pectoris: Secondary | ICD-10-CM

## 2019-03-04 DIAGNOSIS — Z7189 Other specified counseling: Secondary | ICD-10-CM

## 2019-03-04 DIAGNOSIS — I10 Essential (primary) hypertension: Secondary | ICD-10-CM

## 2019-03-04 NOTE — Patient Instructions (Signed)
Medication Instructions:  Your physician recommends that you continue on your current medications as directed. Please refer to the Current Medication list given to you today.  If you need a refill on your cardiac medications before your next appointment, please call your pharmacy.   Lab work: None If you have labs (blood work) drawn today and your tests are completely normal, you will receive your results only by: Marland Kitchen MyChart Message (if you have MyChart) OR . A paper copy in the mail If you have any lab test that is abnormal or we need to change your treatment, we will call you to review the results.  Testing/Procedures: None  Follow-Up: At [facility], you and your health needs are our priority.  As part of our continuing mission to provide you with exceptional heart care, we have created designated Provider Care Teams.  These Care Teams include your primary Cardiologist (physician) and Advanced Practice Providers (APPs -  Physician Assistants and Nurse Practitioners) who all work together to provide you with the care you need, when you need it. You will need a follow up appointment in 9 months.  Please call our office 2 months in advance to schedule this appointment.  You may see Dr. Tamala Julian or one of the following Advanced Practice Providers on your designated Care Team:   Truitt Merle, NP Cecilie Kicks, NP . Kathyrn Drown, NP  Any Other Special Instructions Will Be Listed Below (If Applicable).

## 2019-03-06 DIAGNOSIS — L820 Inflamed seborrheic keratosis: Secondary | ICD-10-CM | POA: Diagnosis not present

## 2019-03-06 DIAGNOSIS — L570 Actinic keratosis: Secondary | ICD-10-CM | POA: Diagnosis not present

## 2019-03-06 DIAGNOSIS — X32XXXD Exposure to sunlight, subsequent encounter: Secondary | ICD-10-CM | POA: Diagnosis not present

## 2019-03-06 DIAGNOSIS — L718 Other rosacea: Secondary | ICD-10-CM | POA: Diagnosis not present

## 2019-03-06 DIAGNOSIS — C44529 Squamous cell carcinoma of skin of other part of trunk: Secondary | ICD-10-CM | POA: Diagnosis not present

## 2019-04-06 DIAGNOSIS — Z9049 Acquired absence of other specified parts of digestive tract: Secondary | ICD-10-CM | POA: Diagnosis not present

## 2019-04-06 DIAGNOSIS — D72819 Decreased white blood cell count, unspecified: Secondary | ICD-10-CM | POA: Diagnosis not present

## 2019-04-06 DIAGNOSIS — E7800 Pure hypercholesterolemia, unspecified: Secondary | ICD-10-CM | POA: Diagnosis not present

## 2019-04-06 DIAGNOSIS — E790 Hyperuricemia without signs of inflammatory arthritis and tophaceous disease: Secondary | ICD-10-CM | POA: Diagnosis not present

## 2019-04-06 DIAGNOSIS — I10 Essential (primary) hypertension: Secondary | ICD-10-CM | POA: Diagnosis not present

## 2019-04-06 DIAGNOSIS — E039 Hypothyroidism, unspecified: Secondary | ICD-10-CM | POA: Diagnosis not present

## 2019-04-06 DIAGNOSIS — D696 Thrombocytopenia, unspecified: Secondary | ICD-10-CM | POA: Diagnosis not present

## 2019-04-06 DIAGNOSIS — I471 Supraventricular tachycardia: Secondary | ICD-10-CM | POA: Diagnosis not present

## 2019-04-23 DIAGNOSIS — R69 Illness, unspecified: Secondary | ICD-10-CM | POA: Diagnosis not present

## 2019-05-08 DIAGNOSIS — E790 Hyperuricemia without signs of inflammatory arthritis and tophaceous disease: Secondary | ICD-10-CM | POA: Diagnosis not present

## 2019-05-08 DIAGNOSIS — L218 Other seborrheic dermatitis: Secondary | ICD-10-CM | POA: Diagnosis not present

## 2019-05-08 DIAGNOSIS — I471 Supraventricular tachycardia: Secondary | ICD-10-CM | POA: Diagnosis not present

## 2019-05-08 DIAGNOSIS — C44529 Squamous cell carcinoma of skin of other part of trunk: Secondary | ICD-10-CM | POA: Diagnosis not present

## 2019-05-08 DIAGNOSIS — E7800 Pure hypercholesterolemia, unspecified: Secondary | ICD-10-CM | POA: Diagnosis not present

## 2019-05-08 DIAGNOSIS — Z9049 Acquired absence of other specified parts of digestive tract: Secondary | ICD-10-CM | POA: Diagnosis not present

## 2019-05-08 DIAGNOSIS — I10 Essential (primary) hypertension: Secondary | ICD-10-CM | POA: Diagnosis not present

## 2019-05-08 DIAGNOSIS — D696 Thrombocytopenia, unspecified: Secondary | ICD-10-CM | POA: Diagnosis not present

## 2019-05-08 DIAGNOSIS — E039 Hypothyroidism, unspecified: Secondary | ICD-10-CM | POA: Diagnosis not present

## 2019-05-08 DIAGNOSIS — D72819 Decreased white blood cell count, unspecified: Secondary | ICD-10-CM | POA: Diagnosis not present

## 2019-06-05 DIAGNOSIS — Z0000 Encounter for general adult medical examination without abnormal findings: Secondary | ICD-10-CM | POA: Diagnosis not present

## 2019-07-14 DIAGNOSIS — R7989 Other specified abnormal findings of blood chemistry: Secondary | ICD-10-CM | POA: Diagnosis not present

## 2019-07-14 DIAGNOSIS — E039 Hypothyroidism, unspecified: Secondary | ICD-10-CM | POA: Diagnosis not present

## 2019-07-14 DIAGNOSIS — R946 Abnormal results of thyroid function studies: Secondary | ICD-10-CM | POA: Diagnosis not present

## 2019-08-07 DIAGNOSIS — C61 Malignant neoplasm of prostate: Secondary | ICD-10-CM | POA: Diagnosis not present

## 2019-08-13 DIAGNOSIS — Z20822 Contact with and (suspected) exposure to covid-19: Secondary | ICD-10-CM | POA: Diagnosis not present

## 2019-08-18 ENCOUNTER — Ambulatory Visit: Payer: Medicare HMO

## 2019-08-18 DIAGNOSIS — I959 Hypotension, unspecified: Secondary | ICD-10-CM | POA: Diagnosis not present

## 2019-08-18 DIAGNOSIS — Z7982 Long term (current) use of aspirin: Secondary | ICD-10-CM | POA: Diagnosis not present

## 2019-08-18 DIAGNOSIS — E785 Hyperlipidemia, unspecified: Secondary | ICD-10-CM | POA: Diagnosis not present

## 2019-08-18 DIAGNOSIS — K219 Gastro-esophageal reflux disease without esophagitis: Secondary | ICD-10-CM | POA: Diagnosis not present

## 2019-08-18 DIAGNOSIS — E669 Obesity, unspecified: Secondary | ICD-10-CM | POA: Diagnosis not present

## 2019-08-18 DIAGNOSIS — J45909 Unspecified asthma, uncomplicated: Secondary | ICD-10-CM | POA: Diagnosis not present

## 2019-08-18 DIAGNOSIS — M1990 Unspecified osteoarthritis, unspecified site: Secondary | ICD-10-CM | POA: Diagnosis not present

## 2019-08-18 DIAGNOSIS — G8929 Other chronic pain: Secondary | ICD-10-CM | POA: Diagnosis not present

## 2019-08-18 DIAGNOSIS — M109 Gout, unspecified: Secondary | ICD-10-CM | POA: Diagnosis not present

## 2019-08-18 DIAGNOSIS — I10 Essential (primary) hypertension: Secondary | ICD-10-CM | POA: Diagnosis not present

## 2019-08-23 DIAGNOSIS — R69 Illness, unspecified: Secondary | ICD-10-CM | POA: Diagnosis not present

## 2019-08-27 ENCOUNTER — Ambulatory Visit: Payer: Medicare HMO

## 2019-08-27 DIAGNOSIS — N5231 Erectile dysfunction following radical prostatectomy: Secondary | ICD-10-CM | POA: Diagnosis not present

## 2019-08-27 DIAGNOSIS — N3941 Urge incontinence: Secondary | ICD-10-CM | POA: Diagnosis not present

## 2019-08-27 DIAGNOSIS — C61 Malignant neoplasm of prostate: Secondary | ICD-10-CM | POA: Diagnosis not present

## 2019-09-03 DIAGNOSIS — R69 Illness, unspecified: Secondary | ICD-10-CM | POA: Diagnosis not present

## 2019-09-03 DIAGNOSIS — M1712 Unilateral primary osteoarthritis, left knee: Secondary | ICD-10-CM | POA: Diagnosis not present

## 2019-09-08 ENCOUNTER — Ambulatory Visit: Payer: Medicare HMO

## 2019-09-14 DIAGNOSIS — M1712 Unilateral primary osteoarthritis, left knee: Secondary | ICD-10-CM | POA: Diagnosis not present

## 2019-09-17 DIAGNOSIS — M1712 Unilateral primary osteoarthritis, left knee: Secondary | ICD-10-CM | POA: Diagnosis not present

## 2019-09-22 DIAGNOSIS — H25011 Cortical age-related cataract, right eye: Secondary | ICD-10-CM | POA: Diagnosis not present

## 2019-09-22 DIAGNOSIS — H2513 Age-related nuclear cataract, bilateral: Secondary | ICD-10-CM | POA: Diagnosis not present

## 2019-09-22 DIAGNOSIS — H5203 Hypermetropia, bilateral: Secondary | ICD-10-CM | POA: Diagnosis not present

## 2019-10-20 ENCOUNTER — Telehealth: Payer: Self-pay | Admitting: Interventional Cardiology

## 2019-10-20 DIAGNOSIS — E7800 Pure hypercholesterolemia, unspecified: Secondary | ICD-10-CM | POA: Diagnosis not present

## 2019-10-20 DIAGNOSIS — E039 Hypothyroidism, unspecified: Secondary | ICD-10-CM | POA: Diagnosis not present

## 2019-10-20 DIAGNOSIS — J4531 Mild persistent asthma with (acute) exacerbation: Secondary | ICD-10-CM | POA: Diagnosis not present

## 2019-10-20 DIAGNOSIS — I10 Essential (primary) hypertension: Secondary | ICD-10-CM | POA: Diagnosis not present

## 2019-10-20 DIAGNOSIS — C61 Malignant neoplasm of prostate: Secondary | ICD-10-CM | POA: Diagnosis not present

## 2019-10-20 NOTE — Telephone Encounter (Signed)
Spoke with someone from [place] and let her know that Dr. Tamala Julian is not back in the office until next week to sign a prescription.  The pharmacist said this would be fine.  Advised I will fax over next week.

## 2019-10-20 NOTE — Telephone Encounter (Signed)
New Message  [facility] called and said that they need a script sent over for the medication diltiazem (CARDIZEM CD) 240 MG 24 hr capsule. Send via fax to [phone]

## 2019-10-28 DIAGNOSIS — E039 Hypothyroidism, unspecified: Secondary | ICD-10-CM | POA: Diagnosis not present

## 2019-10-28 MED ORDER — DILTIAZEM HCL ER COATED BEADS 240 MG PO CP24: 240.0000 mg | ORAL_CAPSULE | Freq: Every day | ORAL | 3 refills | Status: DC

## 2019-10-28 NOTE — Telephone Encounter (Signed)
Prescription printed and faxed to [facility].

## 2019-11-16 DIAGNOSIS — I10 Essential (primary) hypertension: Secondary | ICD-10-CM | POA: Diagnosis not present

## 2019-11-16 DIAGNOSIS — E039 Hypothyroidism, unspecified: Secondary | ICD-10-CM | POA: Diagnosis not present

## 2019-11-16 DIAGNOSIS — C61 Malignant neoplasm of prostate: Secondary | ICD-10-CM | POA: Diagnosis not present

## 2019-11-16 DIAGNOSIS — J4531 Mild persistent asthma with (acute) exacerbation: Secondary | ICD-10-CM | POA: Diagnosis not present

## 2019-11-16 DIAGNOSIS — E7800 Pure hypercholesterolemia, unspecified: Secondary | ICD-10-CM | POA: Diagnosis not present

## 2019-12-22 NOTE — Progress Notes (Signed)
Cardiology Office Note:    Date:  12/24/2019   ID:  Jorge Morrison, DOB 1946/04/17, MRN [id number]  PCP:  [name], MD  Cardiologist:  No primary care provider on file.   Referring MD: [name], MD   Chief Complaint  Patient presents with  . Coronary Artery Disease  . Irregular Heart Beat    History of Present Illness:    Jorge Morrison is a 74 y.o. 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  He has had no recurrence of prolonged tachycardia since that episode.  He indicates that within the past week while hiking after eating breakfast at [facility], he developed "indigestion" while walking up an incline.  After approximately 10 minutes it went away.  He had persisting dyspnea.  He has indigestion from time to time.  Otherwise there are no complaints.  Past Medical History:  Diagnosis Date  . Chest pain 09/09/2018  . Dysrhythmia    treated fr SVT 11/2018, started on medication  . Elevated troponin 09/10/2018  . GERD (gastroesophageal reflux disease)   . Gout 09/10/2018  . High cholesterol   . HLD (hyperlipidemia) 09/10/2018  . Hypertension   . Hypothyroidism 09/10/2018  . Prostate cancer ([place])   . SVT (supraventricular tachycardia) ([place]) 09/10/2018  . Thyroid disease     Past Surgical History:  Procedure Laterality Date  . APPENDECTOMY    . CHOLECYSTECTOMY N/A 02/10/2019   Procedure: LAPAROSCOPIC CHOLECYSTECTOMY;  Surgeon: Ralene Ok, MD;  Location: [place];  Service: General;  Laterality: N/A;  . PROSTATECTOMY    . TONSILLECTOMY      Current Medications: Current Meds  Medication Sig  . albuterol (PROVENTIL HFA) 108 (90 BASE) MCG/ACT inhaler Inhale 2 puffs into the lungs every 6 (six) hours as needed for wheezing or shortness of breath.  . allopurinol (ZYLOPRIM) 300 MG tablet  Take 300 mg by mouth every evening.   Marland Kitchen alprostadil (EDEX) 10 MCG injection 10 mcg by Intracavitary route as needed for erectile dysfunction.   Marland Kitchen aspirin EC 81 MG tablet Take 81 mg by mouth 2 (two) times a day.  Marland Kitchen atorvastatin (LIPITOR) 40 MG tablet Take 40 mg by mouth daily at 6 PM.   . cetirizine (ZYRTEC) 10 MG tablet Take 10 mg by mouth daily as needed for allergies.  Marland Kitchen colchicine 0.6 MG tablet Take 0.6 mg by mouth daily as needed (for gout flare up).   . diltiazem (CARDIZEM CD) 240 MG 24 hr capsule Take 1 capsule (240 mg total) by mouth daily.  Marland Kitchen EPINEPHrine (EPIPEN 2-PAK) 0.3 mg/0.3 mL IJ SOAJ injection Inject 0.3 mg into the muscle once as needed for anaphylaxis.   . fluticasone (FLONASE) 50 MCG/ACT nasal spray Place 1 spray into both nostrils 2 (two) times daily as needed for allergies or rhinitis.  Marland Kitchen GLUCOSAMINE-CHONDROITIN-MSM PO Take 1 tablet by mouth daily.   . Glycerin-Hypromellose-PEG 400 ([employer] DRY EYE RELIEF OP) Place 2 drops into both eyes daily as needed (for dry eyes).  . hydrochlorothiazide (HYDRODIURIL) 12.5 MG tablet Take 1 tablet (12.5 mg total) by mouth daily.  Marland Kitchen levothyroxine (SYNTHROID) 200 MCG tablet Take 200 mcg by mouth every morning.  Marland Kitchen losartan (COZAAR) 100 MG tablet Take 100 mg by mouth daily.  . Omega-3 Krill Oil 500 MG CAPS Take 500 mg by mouth every evening.   Marland Kitchen  omeprazole (PRILOSEC) 20 MG capsule Take 20 mg by mouth daily.  Vladimir Faster Glycol-Propyl Glycol (SYSTANE ULTRA) 0.4-0.3 % SOLN Place 1 drop into both eyes daily as needed (for dry eyes).  . Probiotic Product (PROBIOTIC PO) Take 1 capsule by mouth daily.  . sodium chloride (OCEAN NASAL SPRAY) 0.65 % nasal spray Place 1 spray into the nose daily as needed for congestion.  . traMADol (ULTRAM) 50 MG tablet Take 1 tablet (50 mg total) by mouth every 6 (six) hours as needed.  . Zinc 50 MG CAPS Take 50 mg by mouth daily as needed (for cold).   . [DISCONTINUED] levothyroxine (SYNTHROID) 150 MCG tablet Take 150  mcg by mouth See admin instructions. Take 150 mcg by mouth daily on Sunday, Tuesday, Thursday and Saturday.  . [DISCONTINUED] levothyroxine (SYNTHROID) 175 MCG tablet Take 175 mcg by mouth every Monday, Wednesday, and Friday.      Allergies:   Other and Gramineae pollens   Social History   Socioeconomic History  . Marital status: Married    Spouse name: Not on file  . Number of children: Not on file  . Years of education: Not on file  . Highest education level: Not on file  Occupational History  . Not on file  Tobacco Use  . Smoking status: Former Research scientist (life sciences)  . Smokeless tobacco: Never Used  Substance and Sexual Activity  . Alcohol use: Yes    Comment: daily  . Drug use: No  . Sexual activity: Not on file  Other Topics Concern  . Not on file  Social History Narrative  . Not on file   Social Determinants of Health   Financial Resource Strain:   . Difficulty of Paying Living Expenses:   Food Insecurity:   . Worried About Charity fundraiser in the Last Year:   . Arboriculturist in the Last Year:   Transportation Needs:   . Film/video editor (Medical):   Marland Kitchen Lack of Transportation (Non-Medical):   Physical Activity:   . Days of Exercise per Week:   . Minutes of Exercise per Session:   Stress:   . Feeling of Stress :   Social Connections:   . Frequency of Communication with Friends and Family:   . Frequency of Social Gatherings with Friends and Family:   . Attends Religious Services:   . Active Member of Clubs or Organizations:   . Attends Archivist Meetings:   Marland Kitchen Marital Status:      Family History: The patient's family history includes CAD in his brother and mother.  ROS:   Please see the history of present illness.    Takes his medication as prescribed and has no particular side effects or complaints.  All other systems reviewed and are negative.  EKGs/Labs/Other Studies Reviewed:    The following studies were reviewed today: No recent cardiac  evaluation.  EKG:  EKG normal sinus rhythm with PVC.  EKG is otherwise normal.  Recent Labs: 02/06/2019: ALT 24; BUN 18; Creatinine, Ser 1.10; Hemoglobin 14.9; Platelets 132; Potassium 3.9; Sodium 140  Recent Lipid Panel No results found for: CHOL, TRIG, HDL, CHOLHDL, VLDL, LDLCALC, LDLDIRECT  Physical Exam:    VS:  BP 124/76   Pulse 65   Ht _0  (1.905 m)   Wt 262 lb (118.8 kg)   BMI 32.75 kg/m     Wt Readings from Last 3 Encounters:  12/24/19 262 lb (118.8 kg)  03/04/19 247 lb (112 kg)  02/10/19 256 lb 4.8 oz (116.3 kg)     GEN: Moderate obesity. No acute distress HEENT: Normal NECK: No JVD. LYMPHATICS: No lymphadenopathy CARDIAC:  RRR without murmur, gallop, or edema. VASCULAR:  Normal Pulses. No bruits. RESPIRATORY:  Clear to auscultation without rales, wheezing or rhonchi  ABDOMEN: Soft, non-tender, non-distended, No pulsatile mass, MUSCULOSKELETAL: No deformity  SKIN: Warm and dry NEUROLOGIC:  Alert and oriented x 3 PSYCHIATRIC:  Normal affect   ASSESSMENT:    1. SVT (supraventricular tachycardia) ([place])   2. CAD in native artery   3. Mixed hyperlipidemia   4. Essential hypertension   5. Educated about COVID-19 virus infection   6. Precordial pain    PLAN:    In order of problems listed above:  1. No clinical recurrences 2. Current complaints of indigestion.  Rule out significant CAD.  Given risk factors that include age, weight, sex, hyperlipidemia, and hypertension, coronary CTA will be done to define coronary anatomy and help guide therapy.  Need to exclude that "indigestion" is not simply angina pectoris. 3. LDL cholesterol 87 when last checked.  No change in therapy seems indicated.  Continue Lipitor. 4. Blood pressure is well controlled.  Continue diltiazem HydroDIURIL and Cozaar. 5. COVID-19 vaccine has been received.   Medication Adjustments/Labs and Tests Ordered: Current medicines are reviewed at length with the patient today.  Concerns  regarding medicines are outlined above.  Orders Placed This Encounter  Procedures  . CT CORONARY MORPH W/CTA COR W/SCORE W/CA W/CM &/OR WO/CM  . CT CORONARY FRACTIONAL FLOW RESERVE DATA PREP  . CT CORONARY FRACTIONAL FLOW RESERVE FLUID ANALYSIS  . Basic metabolic panel  . EKG 12-Lead   Meds ordered this encounter  Medications  . metoprolol tartrate (LOPRESSOR) 100 MG tablet    Sig: Take one tablet by mouth 2 hours prior to CT    Dispense:  1 tablet    Refill:  0    Patient Instructions  Medication Instructions:  Your physician recommends that you continue on your current medications as directed. Please refer to the Current Medication list given to you today.  *If you need a refill on your cardiac medications before your next appointment, please call your pharmacy*   Lab Work: None  If you have labs (blood work) drawn today and your tests are completely normal, you will receive your results only by: Marland Kitchen MyChart Message (if you have MyChart) OR . A paper copy in the mail If you have any lab test that is abnormal or we need to change your treatment, we will call you to review the results.   Testing/Procedures: Your physician recommends that you have a Coronary CT performed.   Follow-Up: At [facility], you and your health needs are our priority.  As part of our continuing mission to provide you with exceptional heart care, we have created designated Provider Care Teams.  These Care Teams include your primary Cardiologist (physician) and Advanced Practice Providers (APPs -  Physician Assistants and Nurse Practitioners) who all work together to provide you with the care you need, when you need it.  We recommend signing up for the patient portal called "MyChart".  Sign up information is provided on this After Visit Summary.  MyChart is used to connect with patients for Virtual Visits (Telemedicine).  Patients are able to view lab/test results, encounter notes, upcoming  appointments, etc.  Non-urgent messages can be sent to your provider as well.   To learn more about what you can do  with MyChart, go to [email or web address].    Your next appointment:   12 month(s)  The format for your next appointment:   In Person  Provider:   You may see Dr. Daneen Schick or one of the following Advanced Practice Providers on your designated Care Team:    Truitt Merle, NP  Cecilie Kicks, NP  Kathyrn Drown, NP    Other Instructions  Your cardiac CT will be scheduled at one of the below locations:   [facility] [address] [place], [place] [address] [phone]  [facility] [address] [place], [place] [address] [phone]  If scheduled at [facility], please arrive at the [facility] main entrance of [facility] 30 minutes prior to test start time. Proceed to the [facility] Radiology Department (first floor) to check-in and test prep.  If scheduled at [facility], please arrive 15 mins early for check-in and test prep.  Please follow these instructions carefully (unless otherwise directed):  Hold all erectile dysfunction medications at least 3 days (72 hrs) prior to test.  On the Night Before the Test: . Be sure to Drink plenty of water. . Do not consume any caffeinated/decaffeinated beverages or chocolate 12 hours prior to your test. . Do not take any antihistamines 12 hours prior to your test. . If you take Metformin do not take 24 hours prior to test.   On the Day of the Test: . Drink plenty of water. Do not drink any water within one hour of the test. . Do not eat any food 4 hours prior to the test. . You may take your regular medications prior to the test.  . Take metoprolol (Lopressor) two hours prior to test. . HOLD Furosemide/Hydrochlorothiazide morning of the test       After the Test: . Drink plenty of  water. . After receiving IV contrast, you may experience a mild flushed feeling. This is normal. . On occasion, you may experience a mild rash up to 24 hours after the test. This is not dangerous. If this occurs, you can take Benadryl 25 mg and increase your fluid intake. . If you experience trouble breathing, this can be serious. If it is severe call 911 IMMEDIATELY. If it is mild, please call our office. . If you take any of these medications: Glipizide/Metformin, Avandament, Glucavance, please do not take 48 hours after completing test unless otherwise instructed.   Once we have confirmed authorization from your insurance company, we will call you to set up a date and time for your test.   For non-scheduling related questions, please contact the cardiac imaging nurse navigator should you have any questions/concerns: Marchia Bond, Cardiac Imaging Nurse Navigator Burley Saver, Interim Cardiac Imaging Nurse [place] and Vascular Services Direct Office Dial: [phone]   For scheduling needs, including cancellations and rescheduling, please call [phone].        Signed, Sinclair Grooms, MD  12/24/2019 4:08 PM    [facility]

## 2019-12-24 ENCOUNTER — Other Ambulatory Visit: Payer: Self-pay

## 2019-12-24 ENCOUNTER — Ambulatory Visit: Payer: Medicare HMO | Admitting: Interventional Cardiology

## 2019-12-24 ENCOUNTER — Encounter: Payer: Self-pay | Admitting: Interventional Cardiology

## 2019-12-24 VITALS — BP 124/76 | HR 65 | Ht 75.0 in | Wt 262.0 lb

## 2019-12-24 DIAGNOSIS — R072 Precordial pain: Secondary | ICD-10-CM | POA: Diagnosis not present

## 2019-12-24 DIAGNOSIS — I2510 Atherosclerotic heart disease of native coronary artery without angina pectoris: Secondary | ICD-10-CM | POA: Diagnosis not present

## 2019-12-24 DIAGNOSIS — I10 Essential (primary) hypertension: Secondary | ICD-10-CM | POA: Diagnosis not present

## 2019-12-24 DIAGNOSIS — Z7189 Other specified counseling: Secondary | ICD-10-CM

## 2019-12-24 DIAGNOSIS — I471 Supraventricular tachycardia: Secondary | ICD-10-CM

## 2019-12-24 DIAGNOSIS — E782 Mixed hyperlipidemia: Secondary | ICD-10-CM

## 2019-12-24 MED ORDER — METOPROLOL TARTRATE 100 MG PO TABS
ORAL_TABLET | ORAL | 0 refills | Status: DC
Start: 2019-12-24 — End: 2020-02-11

## 2019-12-24 NOTE — Patient Instructions (Addendum)
Medication Instructions:  Your physician recommends that you continue on your current medications as directed. Please refer to the Current Medication list given to you today.  *If you need a refill on your cardiac medications before your next appointment, please call your pharmacy*   Lab Work: None  If you have labs (blood work) drawn today and your tests are completely normal, you will receive your results only by: Marland Kitchen MyChart Message (if you have MyChart) OR . A paper copy in the mail If you have any lab test that is abnormal or we need to change your treatment, we will call you to review the results.   Testing/Procedures: Your physician recommends that you have a Coronary CT performed.   Follow-Up: At [facility], you and your health needs are our priority.  As part of our continuing mission to provide you with exceptional heart care, we have created designated Provider Care Teams.  These Care Teams include your primary Cardiologist (physician) and Advanced Practice Providers (APPs -  Physician Assistants and Nurse Practitioners) who all work together to provide you with the care you need, when you need it.  We recommend signing up for the patient portal called "MyChart".  Sign up information is provided on this After Visit Summary.  MyChart is used to connect with patients for Virtual Visits (Telemedicine).  Patients are able to view lab/test results, encounter notes, upcoming appointments, etc.  Non-urgent messages can be sent to your provider as well.   To learn more about what you can do with MyChart, go to [email or web address].    Your next appointment:   12 month(s)  The format for your next appointment:   In Person  Provider:   You may see Dr. Daneen Schick or one of the following Advanced Practice Providers on your designated Care Team:    Truitt Merle, NP  Cecilie Kicks, NP  Kathyrn Drown, NP    Other Instructions  Your cardiac CT will be scheduled at one of  the below locations:   [facility] [address] [place], [place] [address] [phone]  [facility] [address] [place], [place] [address] [phone]  If scheduled at [facility], please arrive at the [facility] main entrance of [facility] 30 minutes prior to test start time. Proceed to the [facility] Radiology Department (first floor) to check-in and test prep.  If scheduled at [facility], please arrive 15 mins early for check-in and test prep.  Please follow these instructions carefully (unless otherwise directed):  Hold all erectile dysfunction medications at least 3 days (72 hrs) prior to test.  On the Night Before the Test: . Be sure to Drink plenty of water. . Do not consume any caffeinated/decaffeinated beverages or chocolate 12 hours prior to your test. . Do not take any antihistamines 12 hours prior to your test. . If you take Metformin do not take 24 hours prior to test.   On the Day of the Test: . Drink plenty of water. Do not drink any water within one hour of the test. . Do not eat any food 4 hours prior to the test. . You may take your regular medications prior to the test.  . Take metoprolol (Lopressor) two hours prior to test. . HOLD Furosemide/Hydrochlorothiazide morning of the test       After the Test: . Drink plenty of water. . After receiving IV contrast, you may experience  a mild flushed feeling. This is normal. . On occasion, you may experience a mild rash up to 24 hours after the test. This is not dangerous. If this occurs, you can take Benadryl 25 mg and increase your fluid intake. . If you experience trouble breathing, this can be serious. If it is severe call 911 IMMEDIATELY. If it is mild, please call our office. . If you take any of these medications: Glipizide/Metformin, Avandament, Glucavance, please do not take 48 hours after  completing test unless otherwise instructed.   Once we have confirmed authorization from your insurance company, we will call you to set up a date and time for your test.   For non-scheduling related questions, please contact the cardiac imaging nurse navigator should you have any questions/concerns: Marchia Bond, Cardiac Imaging Nurse Navigator Burley Saver, Interim Cardiac Imaging Nurse [place] [facility] Direct Office Dial: [phone]   For scheduling needs, including cancellations and rescheduling, please call [phone].

## 2020-01-01 DIAGNOSIS — M1712 Unilateral primary osteoarthritis, left knee: Secondary | ICD-10-CM | POA: Diagnosis not present

## 2020-01-01 DIAGNOSIS — E7800 Pure hypercholesterolemia, unspecified: Secondary | ICD-10-CM | POA: Diagnosis not present

## 2020-01-01 DIAGNOSIS — I10 Essential (primary) hypertension: Secondary | ICD-10-CM | POA: Diagnosis not present

## 2020-01-01 DIAGNOSIS — Z9049 Acquired absence of other specified parts of digestive tract: Secondary | ICD-10-CM | POA: Diagnosis not present

## 2020-01-01 DIAGNOSIS — E790 Hyperuricemia without signs of inflammatory arthritis and tophaceous disease: Secondary | ICD-10-CM | POA: Diagnosis not present

## 2020-01-01 DIAGNOSIS — Z23 Encounter for immunization: Secondary | ICD-10-CM | POA: Diagnosis not present

## 2020-01-01 DIAGNOSIS — I471 Supraventricular tachycardia: Secondary | ICD-10-CM | POA: Diagnosis not present

## 2020-01-01 DIAGNOSIS — Z86010 Personal history of colonic polyps: Secondary | ICD-10-CM | POA: Diagnosis not present

## 2020-01-01 DIAGNOSIS — E039 Hypothyroidism, unspecified: Secondary | ICD-10-CM | POA: Diagnosis not present

## 2020-01-01 DIAGNOSIS — D72819 Decreased white blood cell count, unspecified: Secondary | ICD-10-CM | POA: Diagnosis not present

## 2020-01-07 DIAGNOSIS — M25562 Pain in left knee: Secondary | ICD-10-CM | POA: Diagnosis not present

## 2020-01-07 DIAGNOSIS — M1712 Unilateral primary osteoarthritis, left knee: Secondary | ICD-10-CM | POA: Diagnosis not present

## 2020-01-11 ENCOUNTER — Other Ambulatory Visit: Payer: Self-pay

## 2020-01-11 ENCOUNTER — Other Ambulatory Visit: Payer: Medicare HMO | Admitting: *Deleted

## 2020-01-11 ENCOUNTER — Telehealth (HOSPITAL_COMMUNITY): Payer: Self-pay | Admitting: *Deleted

## 2020-01-11 DIAGNOSIS — R072 Precordial pain: Secondary | ICD-10-CM

## 2020-01-11 LAB — BASIC METABOLIC PANEL
BUN/Creatinine Ratio: 18 (ref 10–24)
BUN: 18 mg/dL (ref 8–27)
CO2: 23 mmol/L (ref 20–29)
Calcium: 9.3 mg/dL (ref 8.6–10.2)
Chloride: 102 mmol/L (ref 96–106)
Creatinine, Ser: 1 mg/dL (ref 0.76–1.27)
GFR calc Af Amer: 87 mL/min/{1.73_m2} (ref >59)
GFR calc non Af Amer: 75 mL/min/{1.73_m2} (ref >59)
Glucose: 100 mg/dL — ABNORMAL HIGH (ref 65–99)
Potassium: 4.2 mmol/L (ref 3.5–5.2)
Sodium: 141 mmol/L (ref 134–144)

## 2020-01-11 NOTE — Telephone Encounter (Signed)
Reaching out to patient to offer assistance regarding upcoming cardiac imaging study; pt verbalizes understanding of appt date/time, parking situation and where to check in, pre-test NPO status and medications ordered, and verified current allergies; name and call back number provided for further questions should they arise ° °Camika Marsico Tai RN Navigator Cardiac Imaging °[facility] °[phone] office °[phone] cell ° °

## 2020-01-12 DIAGNOSIS — Z6832 Body mass index (BMI) 32.0-32.9, adult: Secondary | ICD-10-CM | POA: Diagnosis not present

## 2020-01-12 DIAGNOSIS — R072 Precordial pain: Secondary | ICD-10-CM | POA: Diagnosis not present

## 2020-01-12 DIAGNOSIS — R931 Abnormal findings on diagnostic imaging of heart and coronary circulation: Secondary | ICD-10-CM | POA: Diagnosis not present

## 2020-01-13 ENCOUNTER — Ambulatory Visit (HOSPITAL_COMMUNITY)
Admission: RE | Admit: 2020-01-13 | Discharge: 2020-01-13 | Disposition: A | Discharge status: Home / Self Care | Payer: Medicare HMO | Source: Ambulatory Visit | Attending: Interventional Cardiology | Admitting: Interventional Cardiology

## 2020-01-13 ENCOUNTER — Other Ambulatory Visit: Payer: Self-pay

## 2020-01-13 DIAGNOSIS — I10 Essential (primary) hypertension: Secondary | ICD-10-CM | POA: Insufficient documentation

## 2020-01-13 DIAGNOSIS — I2510 Atherosclerotic heart disease of native coronary artery without angina pectoris: Secondary | ICD-10-CM | POA: Insufficient documentation

## 2020-01-13 DIAGNOSIS — R072 Precordial pain: Secondary | ICD-10-CM

## 2020-01-13 DIAGNOSIS — E785 Hyperlipidemia, unspecified: Secondary | ICD-10-CM | POA: Insufficient documentation

## 2020-01-13 DIAGNOSIS — I700 Atherosclerosis of aorta: Secondary | ICD-10-CM | POA: Diagnosis not present

## 2020-01-13 MED ORDER — NITROGLYCERIN 0.4 MG SL SUBL
0.8000 mg | SUBLINGUAL_TABLET | Freq: Once | SUBLINGUAL | Status: AC
  Administered 2020-01-13: 0.8 mg via SUBLINGUAL

## 2020-01-13 MED ORDER — NITROGLYCERIN 0.4 MG SL SUBL
SUBLINGUAL_TABLET | SUBLINGUAL | Status: AC
  Filled 2020-01-13: qty 2

## 2020-01-13 MED ORDER — IOHEXOL 350 MG/ML SOLN
80.0000 mL | Freq: Once | INTRAVENOUS | Status: AC | PRN
  Administered 2020-01-13: 80 mL via INTRAVENOUS

## 2020-01-14 DIAGNOSIS — M1712 Unilateral primary osteoarthritis, left knee: Secondary | ICD-10-CM | POA: Diagnosis not present

## 2020-01-14 DIAGNOSIS — R072 Precordial pain: Secondary | ICD-10-CM | POA: Diagnosis not present

## 2020-01-14 DIAGNOSIS — R931 Abnormal findings on diagnostic imaging of heart and coronary circulation: Secondary | ICD-10-CM | POA: Diagnosis not present

## 2020-01-14 DIAGNOSIS — Z6832 Body mass index (BMI) 32.0-32.9, adult: Secondary | ICD-10-CM | POA: Diagnosis not present

## 2020-01-15 ENCOUNTER — Telehealth: Payer: Self-pay | Admitting: *Deleted

## 2020-01-15 MED ORDER — NITROGLYCERIN 0.4 MG SL SUBL: 0.4000 mg | SUBLINGUAL_TABLET | SUBLINGUAL | 3 refills | Status: AC | PRN

## 2020-01-15 NOTE — Telephone Encounter (Signed)
-----   Message from Belva Crome, MD sent at 01/14/2020  3:37 PM EDT ----- Let the patient know three vessel plaque build up but no critical / proximal vessel disease. Plan medical therapy, risk factor modification and further discussion at [place].Needs repeat OV later this summer. Earlier if CP continues. SL NTG if prolonged pain. Advise how to use NTG. A copy will be sent to [name], MD

## 2020-01-18 ENCOUNTER — Telehealth: Payer: Self-pay | Admitting: *Deleted

## 2020-01-18 MED ORDER — ISOSORBIDE MONONITRATE ER 60 MG PO TB24
60.0000 mg | ORAL_TABLET | Freq: Every day | ORAL | 3 refills | Status: DC
Start: 2020-01-18 — End: 2020-02-11

## 2020-01-18 NOTE — Telephone Encounter (Signed)
Spoke with pt and reviewed results and recommendations per Dr. Tamala Julian.  Advised I will send in 60mg  tablets but only take half for the first week.  Pt verbalized understanding and was in agreement with plan.

## 2020-01-18 NOTE — Telephone Encounter (Signed)
-----   Message from Belva Crome, MD sent at 01/18/2020 12:53 PM EDT ----- Let the patient know there is moderate plaque buildup in the left anterior descending and less so in the other 2 vessels.  There is suggestion that the vessel may be causing reduced flow beyond the mid segment.  Start isosorbide mononitrate 30 mg daily and if tolerating okay after 1 week, increase to 60 mg/day.  Return to see me in 4 to 5 weeks or earlier if he is still having exertional "indigestion".  We may need to perform coronary angiography. A copy will be sent to [name], MD

## 2020-01-19 DIAGNOSIS — E039 Hypothyroidism, unspecified: Secondary | ICD-10-CM | POA: Diagnosis not present

## 2020-01-19 DIAGNOSIS — J4520 Mild intermittent asthma, uncomplicated: Secondary | ICD-10-CM | POA: Diagnosis not present

## 2020-01-19 DIAGNOSIS — I10 Essential (primary) hypertension: Secondary | ICD-10-CM | POA: Diagnosis not present

## 2020-01-19 DIAGNOSIS — C61 Malignant neoplasm of prostate: Secondary | ICD-10-CM | POA: Diagnosis not present

## 2020-01-19 DIAGNOSIS — J4531 Mild persistent asthma with (acute) exacerbation: Secondary | ICD-10-CM | POA: Diagnosis not present

## 2020-01-19 DIAGNOSIS — E7800 Pure hypercholesterolemia, unspecified: Secondary | ICD-10-CM | POA: Diagnosis not present

## 2020-01-19 DIAGNOSIS — M1712 Unilateral primary osteoarthritis, left knee: Secondary | ICD-10-CM | POA: Diagnosis not present

## 2020-02-10 NOTE — Progress Notes (Signed)
Cardiology Office Note:    Date:  02/11/2020   ID:  Jorge Morrison, DOB 1946-02-27, MRN [id number]  PCP:  [name], MD  Cardiologist:  Sinclair Grooms, MD   Referring MD: [name], MD   Chief Complaint  Patient presents with  . Coronary Artery Disease  . Shortness of Breath  . Chest Pain    History of Present Illness:    Jorge Morrison is a 74 y.o. 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  Has had chest pain associated with episodes of AV nodal reentrant tachycardia.  Has other vague complaints of chest discomfort that are fleeting, lasts up to several minutes, before resolving.  Episodes are not precipitated by physical activity.  Because of concern, isosorbide mononitrate was added.  A coronary CT angiogram with morphology and FFR was performed and was mildly abnormal.  He has no proximal vessel disease or left main disease.  In the very distal LAD there is FFR evidence of reduced blood flow.  He denies exertion related discomfort.  He is able to ambulate without limiting chest discomfort.  He does have some dyspnea on exertion.  He has had shortness of breath with exertion but has gained weight, has become deconditioned during the Covid pandemic, and is looking to improve his overall conditioning with repetitive activity.  CT scan was performed and results outlined below.  He is back today to discuss the findings and to develop a management strategy for his current conditions.  Past Medical History:  Diagnosis Date  . Chest pain 09/09/2018  . Dysrhythmia    treated fr SVT 11/2018, started on medication  . Elevated troponin 09/10/2018  . GERD (gastroesophageal reflux disease)   . Gout 09/10/2018  . High cholesterol   . HLD (hyperlipidemia) 09/10/2018  . Hypertension   . Hypothyroidism 09/10/2018  . Prostate cancer ([place])   . SVT (supraventricular tachycardia) ([place])  09/10/2018  . Thyroid disease     Past Surgical History:  Procedure Laterality Date  . APPENDECTOMY    . CHOLECYSTECTOMY N/A 02/10/2019   Procedure: LAPAROSCOPIC CHOLECYSTECTOMY;  Surgeon: Ralene Ok, MD;  Location: [facility];  Service: General;  Laterality: N/A;  . PROSTATECTOMY    . TONSILLECTOMY      Current Medications: Current Meds  Medication Sig  . albuterol (PROVENTIL HFA) 108 (90 BASE) MCG/ACT inhaler Inhale 2 puffs into the lungs every 6 (six) hours as needed for wheezing or shortness of breath.  . allopurinol (ZYLOPRIM) 300 MG tablet Take 300 mg by mouth every evening.   Marland Kitchen alprostadil (EDEX) 10 MCG injection 10 mcg by Intracavitary route as needed for erectile dysfunction.   Marland Kitchen aspirin EC 81 MG tablet Take 81 mg by mouth 2 (two) times a day.  . cetirizine (ZYRTEC) 10 MG tablet Take 10 mg by mouth daily as needed for allergies.  Marland Kitchen colchicine 0.6 MG tablet Take 0.6 mg by mouth daily as needed (for gout flare up).   . diltiazem (CARDIZEM CD) 240 MG 24 hr capsule Take 1 capsule (240 mg total) by mouth daily.  Marland Kitchen EPINEPHrine (EPIPEN 2-PAK) 0.3 mg/0.3 mL IJ SOAJ injection Inject 0.3 mg into the muscle once as needed for anaphylaxis.   . fluticasone (FLONASE) 50 MCG/ACT nasal spray Place 1 spray into both nostrils 2 (two) times daily as needed for allergies or rhinitis.  Marland Kitchen GLUCOSAMINE-CHONDROITIN-MSM PO Take 1 tablet by mouth daily.   Marland Kitchen  Glycerin-Hypromellose-PEG 400 ([employer] DRY EYE RELIEF OP) Place 2 drops into both eyes daily as needed (for dry eyes).  . hydrochlorothiazide (HYDRODIURIL) 12.5 MG tablet Take 1 tablet (12.5 mg total) by mouth daily.  Marland Kitchen levothyroxine (SYNTHROID) 200 MCG tablet Take 200 mcg by mouth every morning.  Marland Kitchen losartan (COZAAR) 100 MG tablet Take 100 mg by mouth daily.  . nitroGLYCERIN (NITROSTAT) 0.4 MG SL tablet Place 1 tablet (0.4 mg total) under the tongue every 5 (five) minutes as needed for chest pain.  . Omega-3 Krill Oil 500 MG CAPS Take 500 mg by mouth  every evening.   Marland Kitchen omeprazole (PRILOSEC) 20 MG capsule Take 20 mg by mouth daily.  Vladimir Faster Glycol-Propyl Glycol (SYSTANE ULTRA) 0.4-0.3 % SOLN Place 1 drop into both eyes daily as needed (for dry eyes).  . Probiotic Product (PROBIOTIC PO) Take 1 capsule by mouth daily.  . rosuvastatin (CRESTOR) 20 MG tablet Take 20 mg by mouth daily.  . sodium chloride (OCEAN NASAL SPRAY) 0.65 % nasal spray Place 1 spray into the nose daily as needed for congestion.  . Zinc 50 MG CAPS Take 50 mg by mouth daily as needed (for cold).   . [DISCONTINUED] isosorbide mononitrate (IMDUR) 60 MG 24 hr tablet Take 1 tablet (60 mg total) by mouth daily.     Allergies:   Other and Gramineae pollens   Social History   Socioeconomic History  . Marital status: Married    Spouse name: Not on file  . Number of children: Not on file  . Years of education: Not on file  . Highest education level: Not on file  Occupational History  . Not on file  Tobacco Use  . Smoking status: Former Research scientist (life sciences)  . Smokeless tobacco: Never Used  Vaping Use  . Vaping Use: Never used  Substance and Sexual Activity  . Alcohol use: Yes    Comment: daily  . Drug use: No  . Sexual activity: Not on file  Other Topics Concern  . Not on file  Social History Narrative  . Not on file   Social Determinants of Health   Financial Resource Strain:   . Difficulty of Paying Living Expenses:   Food Insecurity:   . Worried About Charity fundraiser in the Last Year:   . Arboriculturist in the Last Year:   Transportation Needs:   . Film/video editor (Medical):   Marland Kitchen Lack of Transportation (Non-Medical):   Physical Activity:   . Days of Exercise per Week:   . Minutes of Exercise per Session:   Stress:   . Feeling of Stress :   Social Connections:   . Frequency of Communication with Friends and Family:   . Frequency of Social Gatherings with Friends and Family:   . Attends Religious Services:   . Active Member of Clubs or  Organizations:   . Attends Archivist Meetings:   Marland Kitchen Marital Status:      Family History: The patient's family history includes CAD in his brother and mother.  ROS:   Please see the history of present illness.    Concerned about the findings on the CT.  His wife accompanied with him.  We discussed the results.  All other systems reviewed and are negative.  EKGs/Labs/Other Studies Reviewed:   :  The following studies were reviewed today:  COR CT ANGIOGRAM with Morph 12/2019:  IMPRESSION: 1. Coronary calcium score of 1960. This was 92nd percentile for age and  sex matched control.  2. Normal coronary origin with right dominance.  3. There are diffuse calcifications in all three coronary distributions.  4.  There is at least moderate (50-69%) stenosis in the LAD and D 1.  5.  Mild calcification of the aortic valve and descending aorta.  6.  Will send study for FFR.  7. Recommend aggressive risk factor modification, including LDL reduction to <70.  EKG:  EKG not repeated on this occasion  Recent Labs: 01/11/2020: BUN 18; Creatinine, Ser 1.00; Potassium 4.2; Sodium 141  Recent Lipid Panel No results found for: CHOL, TRIG, HDL, CHOLHDL, VLDL, LDLCALC, LDLDIRECT  Physical Exam:    VS:  BP (!) 110/60   Pulse 77   Ht 6\' 3"  (1.905 m)   Wt (!) 261 lb 12.8 oz (118.8 kg)   SpO2 95%   BMI 32.72 kg/m     Wt Readings from Last 3 Encounters:  02/11/20 (!) 261 lb 12.8 oz (118.8 kg)  12/24/19 262 lb (118.8 kg)  03/04/19 247 lb (112 kg)     GEN: Moderate obesity. No acute distress HEENT: Normal NECK: No JVD. LYMPHATICS: No lymphadenopathy CARDIAC:  RRR without murmur, gallop, or edema. VASCULAR:  Normal Pulses. No bruits. RESPIRATORY:  Clear to auscultation without rales, wheezing or rhonchi  ABDOMEN: Soft, non-tender, non-distended, No pulsatile mass, MUSCULOSKELETAL: No deformity  SKIN: Warm and dry NEUROLOGIC:  Alert and oriented x 3 PSYCHIATRIC:   Normal affect   ASSESSMENT:    1. CAD in native artery   2. SVT (supraventricular tachycardia) ([place])   3. Mixed hyperlipidemia   4. Essential hypertension   5. Educated about COVID-19 virus infection    PLAN:    In order of problems listed above:  1. Moderate diffuse coronary disease without left main or proximal coronary obstruction.  Mid LAD contains an obstruction up to 70%.  LAD territory by [facility] demonstrates very small apical region of reduced flow.  At this point we will embark upon aggressive risk factor modification.  He has had headache and itching since starting isosorbide.  He notices no significant improvement since starting isosorbide.  I have recommended that he discontinue the medication and observe if any worsening in his current state. 2. Continue diltiazem CD 240 mg/day.  No recent episode.   3. Aggressive LDL lowering.  Crestor has been increased to 20 mg/day from 10 milligrams daily.  An LDL will be repeated in 6 to 8 weeks. 4. Blood pressure is well controlled.  Continue diltiazem, HydroDIURIL, and low-salt diet.  Encouraged aerobic activity and he should report exertional related chest discomfort or symptoms. 5. He has been vaccinated and is practicing social distancing.   Overall education and awareness concerning primary/secondary risk prevention was discussed in detail: LDL less than 70, hemoglobin A1c less than 7, blood pressure target less than 130/80 mmHg, >150 minutes of moderate aerobic activity per week, avoidance of smoking, weight control (via diet and exercise), and continued surveillance/management of/for obstructive sleep apnea.  Notify us if any change in overall condition after discontinuation of isosorbide mononitrate.  We reviewed the FFR CT by Heart Flow model and pointed out the very small area of mild ischemia near the apex.  It is not felt that this is actionable from the standpoint of revascularization.  If symptoms accelerate, he would need to have  coronary angiography.  Medication Adjustments/Labs and Tests Ordered: Current medicines are reviewed at length with the patient today.  Concerns regarding medicines are outlined above.  No  orders of the defined types were placed in this encounter.  No orders of the defined types were placed in this encounter.   Patient Instructions  Medication Instructions:  1. Stop the (Imdur) Isosorbide  *If you need a refill on your cardiac medications before your next appointment, please call your pharmacy*   Lab Work: None If you have labs (blood work) drawn today and your tests are completely normal, you will receive your results only by: Marland Kitchen MyChart Message (if you have MyChart) OR . A paper copy in the mail If you have any lab test that is abnormal or we need to change your treatment, we will call you to review the results.   Testing/Procedures: None   Follow-Up: At [facility], you and your health needs are our priority.  As part of our continuing mission to provide you with exceptional heart care, we have created designated Provider Care Teams.  These Care Teams include your primary Cardiologist (physician) and Advanced Practice Providers (APPs -  Physician Assistants and Nurse Practitioners) who all work together to provide you with the care you need, when you need it.  We recommend signing up for the patient portal called "MyChart".  Sign up information is provided on this After Visit Summary.  MyChart is used to connect with patients for Virtual Visits (Telemedicine).  Patients are able to view lab/test results, encounter notes, upcoming appointments, etc.  Non-urgent messages can be sent to your provider as well.   To learn more about what you can do with MyChart, go to [email or web address].    Your next appointment:   6 month(s)  The format for your next appointment:   In Person  Provider:   You may see Sinclair Grooms, MD or one of the following Advanced Practice  Providers on your designated Care Team:    Truitt Merle, NP  Cecilie Kicks, NP  Kathyrn Drown, NP         Signed, Sinclair Grooms, MD  02/11/2020 6:37 PM    [place]

## 2020-02-11 ENCOUNTER — Ambulatory Visit: Payer: Medicare HMO | Admitting: Interventional Cardiology

## 2020-02-11 ENCOUNTER — Encounter: Payer: Self-pay | Admitting: Interventional Cardiology

## 2020-02-11 ENCOUNTER — Other Ambulatory Visit: Payer: Self-pay

## 2020-02-11 VITALS — BP 110/60 | HR 77 | Ht 75.0 in | Wt 261.8 lb

## 2020-02-11 DIAGNOSIS — I10 Essential (primary) hypertension: Secondary | ICD-10-CM | POA: Diagnosis not present

## 2020-02-11 DIAGNOSIS — M1712 Unilateral primary osteoarthritis, left knee: Secondary | ICD-10-CM | POA: Diagnosis not present

## 2020-02-11 DIAGNOSIS — I2510 Atherosclerotic heart disease of native coronary artery without angina pectoris: Secondary | ICD-10-CM | POA: Diagnosis not present

## 2020-02-11 DIAGNOSIS — E7800 Pure hypercholesterolemia, unspecified: Secondary | ICD-10-CM | POA: Diagnosis not present

## 2020-02-11 DIAGNOSIS — I471 Supraventricular tachycardia: Secondary | ICD-10-CM

## 2020-02-11 DIAGNOSIS — J4520 Mild intermittent asthma, uncomplicated: Secondary | ICD-10-CM | POA: Diagnosis not present

## 2020-02-11 DIAGNOSIS — E782 Mixed hyperlipidemia: Secondary | ICD-10-CM

## 2020-02-11 DIAGNOSIS — Z7189 Other specified counseling: Secondary | ICD-10-CM

## 2020-02-11 DIAGNOSIS — J4531 Mild persistent asthma with (acute) exacerbation: Secondary | ICD-10-CM | POA: Diagnosis not present

## 2020-02-11 DIAGNOSIS — C61 Malignant neoplasm of prostate: Secondary | ICD-10-CM | POA: Diagnosis not present

## 2020-02-11 DIAGNOSIS — E039 Hypothyroidism, unspecified: Secondary | ICD-10-CM | POA: Diagnosis not present

## 2020-02-11 NOTE — Patient Instructions (Signed)
Medication Instructions:  1. Stop the (Imdur) Isosorbide  *If you need a refill on your cardiac medications before your next appointment, please call your pharmacy*   Lab Work: None If you have labs (blood work) drawn today and your tests are completely normal, you will receive your results only by: Marland Kitchen MyChart Message (if you have MyChart) OR . A paper copy in the mail If you have any lab test that is abnormal or we need to change your treatment, we will call you to review the results.   Testing/Procedures: None   Follow-Up: At [facility], you and your health needs are our priority.  As part of our continuing mission to provide you with exceptional heart care, we have created designated Provider Care Teams.  These Care Teams include your primary Cardiologist (physician) and Advanced Practice Providers (APPs -  Physician Assistants and Nurse Practitioners) who all work together to provide you with the care you need, when you need it.  We recommend signing up for the patient portal called "MyChart".  Sign up information is provided on this After Visit Summary.  MyChart is used to connect with patients for Virtual Visits (Telemedicine).  Patients are able to view lab/test results, encounter notes, upcoming appointments, etc.  Non-urgent messages can be sent to your provider as well.   To learn more about what you can do with MyChart, go to [email or web address].    Your next appointment:   6 month(s)  The format for your next appointment:   In Person  Provider:   You may see Sinclair Grooms, MD or one of the following Advanced Practice Providers on your designated Care Team:    Truitt Merle, NP  Cecilie Kicks, NP  Kathyrn Drown, NP

## 2020-02-23 DIAGNOSIS — Z01818 Encounter for other preprocedural examination: Secondary | ICD-10-CM | POA: Diagnosis not present

## 2020-02-23 DIAGNOSIS — I471 Supraventricular tachycardia: Secondary | ICD-10-CM | POA: Diagnosis not present

## 2020-02-23 DIAGNOSIS — M1712 Unilateral primary osteoarthritis, left knee: Secondary | ICD-10-CM | POA: Diagnosis not present

## 2020-02-23 DIAGNOSIS — I10 Essential (primary) hypertension: Secondary | ICD-10-CM | POA: Diagnosis not present

## 2020-02-23 DIAGNOSIS — E7800 Pure hypercholesterolemia, unspecified: Secondary | ICD-10-CM | POA: Diagnosis not present

## 2020-02-23 DIAGNOSIS — E039 Hypothyroidism, unspecified: Secondary | ICD-10-CM | POA: Diagnosis not present

## 2020-02-23 DIAGNOSIS — J4531 Mild persistent asthma with (acute) exacerbation: Secondary | ICD-10-CM | POA: Diagnosis not present

## 2020-02-23 DIAGNOSIS — C61 Malignant neoplasm of prostate: Secondary | ICD-10-CM | POA: Diagnosis not present

## 2020-02-23 DIAGNOSIS — I2510 Atherosclerotic heart disease of native coronary artery without angina pectoris: Secondary | ICD-10-CM | POA: Diagnosis not present

## 2020-02-23 DIAGNOSIS — J4520 Mild intermittent asthma, uncomplicated: Secondary | ICD-10-CM | POA: Diagnosis not present

## 2020-03-01 DIAGNOSIS — Z08 Encounter for follow-up examination after completed treatment for malignant neoplasm: Secondary | ICD-10-CM | POA: Diagnosis not present

## 2020-03-01 DIAGNOSIS — C44319 Basal cell carcinoma of skin of other parts of face: Secondary | ICD-10-CM | POA: Diagnosis not present

## 2020-03-01 DIAGNOSIS — X32XXXD Exposure to sunlight, subsequent encounter: Secondary | ICD-10-CM | POA: Diagnosis not present

## 2020-03-01 DIAGNOSIS — L570 Actinic keratosis: Secondary | ICD-10-CM | POA: Diagnosis not present

## 2020-03-01 DIAGNOSIS — Z85858 Personal history of malignant neoplasm of other endocrine glands: Secondary | ICD-10-CM | POA: Diagnosis not present

## 2020-03-01 DIAGNOSIS — D0439 Carcinoma in situ of skin of other parts of face: Secondary | ICD-10-CM | POA: Diagnosis not present

## 2020-03-14 DIAGNOSIS — E039 Hypothyroidism, unspecified: Secondary | ICD-10-CM | POA: Diagnosis not present

## 2020-03-14 DIAGNOSIS — E7800 Pure hypercholesterolemia, unspecified: Secondary | ICD-10-CM | POA: Diagnosis not present

## 2020-03-14 DIAGNOSIS — M1712 Unilateral primary osteoarthritis, left knee: Secondary | ICD-10-CM | POA: Diagnosis not present

## 2020-03-14 DIAGNOSIS — C61 Malignant neoplasm of prostate: Secondary | ICD-10-CM | POA: Diagnosis not present

## 2020-03-14 DIAGNOSIS — I2510 Atherosclerotic heart disease of native coronary artery without angina pectoris: Secondary | ICD-10-CM | POA: Diagnosis not present

## 2020-03-14 DIAGNOSIS — I10 Essential (primary) hypertension: Secondary | ICD-10-CM | POA: Diagnosis not present

## 2020-03-14 DIAGNOSIS — J4531 Mild persistent asthma with (acute) exacerbation: Secondary | ICD-10-CM | POA: Diagnosis not present

## 2020-03-14 DIAGNOSIS — J4520 Mild intermittent asthma, uncomplicated: Secondary | ICD-10-CM | POA: Diagnosis not present

## 2020-04-05 DIAGNOSIS — I10 Essential (primary) hypertension: Secondary | ICD-10-CM | POA: Diagnosis not present

## 2020-04-05 DIAGNOSIS — E7800 Pure hypercholesterolemia, unspecified: Secondary | ICD-10-CM | POA: Diagnosis not present

## 2020-04-05 DIAGNOSIS — M1712 Unilateral primary osteoarthritis, left knee: Secondary | ICD-10-CM | POA: Diagnosis not present

## 2020-04-05 DIAGNOSIS — C61 Malignant neoplasm of prostate: Secondary | ICD-10-CM | POA: Diagnosis not present

## 2020-04-05 DIAGNOSIS — E039 Hypothyroidism, unspecified: Secondary | ICD-10-CM | POA: Diagnosis not present

## 2020-04-05 DIAGNOSIS — J4520 Mild intermittent asthma, uncomplicated: Secondary | ICD-10-CM | POA: Diagnosis not present

## 2020-04-05 DIAGNOSIS — J4531 Mild persistent asthma with (acute) exacerbation: Secondary | ICD-10-CM | POA: Diagnosis not present

## 2020-04-05 DIAGNOSIS — I2510 Atherosclerotic heart disease of native coronary artery without angina pectoris: Secondary | ICD-10-CM | POA: Diagnosis not present

## 2020-04-12 DIAGNOSIS — X32XXXD Exposure to sunlight, subsequent encounter: Secondary | ICD-10-CM | POA: Diagnosis not present

## 2020-04-12 DIAGNOSIS — C44519 Basal cell carcinoma of skin of other part of trunk: Secondary | ICD-10-CM | POA: Diagnosis not present

## 2020-04-12 DIAGNOSIS — R69 Illness, unspecified: Secondary | ICD-10-CM | POA: Diagnosis not present

## 2020-04-12 DIAGNOSIS — L570 Actinic keratosis: Secondary | ICD-10-CM | POA: Diagnosis not present

## 2020-04-12 DIAGNOSIS — Z85828 Personal history of other malignant neoplasm of skin: Secondary | ICD-10-CM | POA: Diagnosis not present

## 2020-04-12 DIAGNOSIS — Z08 Encounter for follow-up examination after completed treatment for malignant neoplasm: Secondary | ICD-10-CM | POA: Diagnosis not present

## 2020-05-03 DIAGNOSIS — J4520 Mild intermittent asthma, uncomplicated: Secondary | ICD-10-CM | POA: Diagnosis not present

## 2020-05-03 DIAGNOSIS — J4531 Mild persistent asthma with (acute) exacerbation: Secondary | ICD-10-CM | POA: Diagnosis not present

## 2020-05-03 DIAGNOSIS — C61 Malignant neoplasm of prostate: Secondary | ICD-10-CM | POA: Diagnosis not present

## 2020-05-03 DIAGNOSIS — E7800 Pure hypercholesterolemia, unspecified: Secondary | ICD-10-CM | POA: Diagnosis not present

## 2020-05-03 DIAGNOSIS — I2510 Atherosclerotic heart disease of native coronary artery without angina pectoris: Secondary | ICD-10-CM | POA: Diagnosis not present

## 2020-05-03 DIAGNOSIS — I10 Essential (primary) hypertension: Secondary | ICD-10-CM | POA: Diagnosis not present

## 2020-05-03 DIAGNOSIS — M1712 Unilateral primary osteoarthritis, left knee: Secondary | ICD-10-CM | POA: Diagnosis not present

## 2020-05-03 DIAGNOSIS — E039 Hypothyroidism, unspecified: Secondary | ICD-10-CM | POA: Diagnosis not present

## 2020-05-09 DIAGNOSIS — E7800 Pure hypercholesterolemia, unspecified: Secondary | ICD-10-CM | POA: Diagnosis not present

## 2020-05-09 DIAGNOSIS — C61 Malignant neoplasm of prostate: Secondary | ICD-10-CM | POA: Diagnosis not present

## 2020-05-09 DIAGNOSIS — I2510 Atherosclerotic heart disease of native coronary artery without angina pectoris: Secondary | ICD-10-CM | POA: Diagnosis not present

## 2020-05-09 DIAGNOSIS — J4531 Mild persistent asthma with (acute) exacerbation: Secondary | ICD-10-CM | POA: Diagnosis not present

## 2020-05-09 DIAGNOSIS — E039 Hypothyroidism, unspecified: Secondary | ICD-10-CM | POA: Diagnosis not present

## 2020-05-09 DIAGNOSIS — M1712 Unilateral primary osteoarthritis, left knee: Secondary | ICD-10-CM | POA: Diagnosis not present

## 2020-05-09 DIAGNOSIS — J4520 Mild intermittent asthma, uncomplicated: Secondary | ICD-10-CM | POA: Diagnosis not present

## 2020-05-09 DIAGNOSIS — I10 Essential (primary) hypertension: Secondary | ICD-10-CM | POA: Diagnosis not present

## 2020-05-16 DIAGNOSIS — Z96652 Presence of left artificial knee joint: Secondary | ICD-10-CM | POA: Diagnosis not present

## 2020-05-16 DIAGNOSIS — G8918 Other acute postprocedural pain: Secondary | ICD-10-CM | POA: Diagnosis not present

## 2020-05-16 DIAGNOSIS — M21162 Varus deformity, not elsewhere classified, left knee: Secondary | ICD-10-CM | POA: Diagnosis not present

## 2020-05-16 DIAGNOSIS — M1712 Unilateral primary osteoarthritis, left knee: Secondary | ICD-10-CM | POA: Diagnosis not present

## 2020-05-23 DIAGNOSIS — M25662 Stiffness of left knee, not elsewhere classified: Secondary | ICD-10-CM | POA: Diagnosis not present

## 2020-05-23 DIAGNOSIS — M25562 Pain in left knee: Secondary | ICD-10-CM | POA: Diagnosis not present

## 2020-05-23 DIAGNOSIS — R262 Difficulty in walking, not elsewhere classified: Secondary | ICD-10-CM | POA: Diagnosis not present

## 2020-05-23 DIAGNOSIS — M6281 Muscle weakness (generalized): Secondary | ICD-10-CM | POA: Diagnosis not present

## 2020-05-24 DIAGNOSIS — M25662 Stiffness of left knee, not elsewhere classified: Secondary | ICD-10-CM | POA: Diagnosis not present

## 2020-05-24 DIAGNOSIS — R262 Difficulty in walking, not elsewhere classified: Secondary | ICD-10-CM | POA: Diagnosis not present

## 2020-05-24 DIAGNOSIS — M6281 Muscle weakness (generalized): Secondary | ICD-10-CM | POA: Diagnosis not present

## 2020-05-24 DIAGNOSIS — M25562 Pain in left knee: Secondary | ICD-10-CM | POA: Diagnosis not present

## 2020-05-26 DIAGNOSIS — Z96652 Presence of left artificial knee joint: Secondary | ICD-10-CM | POA: Diagnosis not present

## 2020-05-27 DIAGNOSIS — M25662 Stiffness of left knee, not elsewhere classified: Secondary | ICD-10-CM | POA: Diagnosis not present

## 2020-05-27 DIAGNOSIS — R262 Difficulty in walking, not elsewhere classified: Secondary | ICD-10-CM | POA: Diagnosis not present

## 2020-05-27 DIAGNOSIS — M6281 Muscle weakness (generalized): Secondary | ICD-10-CM | POA: Diagnosis not present

## 2020-05-27 DIAGNOSIS — M25562 Pain in left knee: Secondary | ICD-10-CM | POA: Diagnosis not present

## 2020-05-30 DIAGNOSIS — M25662 Stiffness of left knee, not elsewhere classified: Secondary | ICD-10-CM | POA: Diagnosis not present

## 2020-05-30 DIAGNOSIS — M25562 Pain in left knee: Secondary | ICD-10-CM | POA: Diagnosis not present

## 2020-05-30 DIAGNOSIS — R262 Difficulty in walking, not elsewhere classified: Secondary | ICD-10-CM | POA: Diagnosis not present

## 2020-05-30 DIAGNOSIS — M6281 Muscle weakness (generalized): Secondary | ICD-10-CM | POA: Diagnosis not present

## 2020-06-01 DIAGNOSIS — M25662 Stiffness of left knee, not elsewhere classified: Secondary | ICD-10-CM | POA: Diagnosis not present

## 2020-06-01 DIAGNOSIS — M25562 Pain in left knee: Secondary | ICD-10-CM | POA: Diagnosis not present

## 2020-06-01 DIAGNOSIS — M6281 Muscle weakness (generalized): Secondary | ICD-10-CM | POA: Diagnosis not present

## 2020-06-01 DIAGNOSIS — R262 Difficulty in walking, not elsewhere classified: Secondary | ICD-10-CM | POA: Diagnosis not present

## 2020-06-03 DIAGNOSIS — M25662 Stiffness of left knee, not elsewhere classified: Secondary | ICD-10-CM | POA: Diagnosis not present

## 2020-06-03 DIAGNOSIS — M25562 Pain in left knee: Secondary | ICD-10-CM | POA: Diagnosis not present

## 2020-06-03 DIAGNOSIS — R262 Difficulty in walking, not elsewhere classified: Secondary | ICD-10-CM | POA: Diagnosis not present

## 2020-06-03 DIAGNOSIS — M6281 Muscle weakness (generalized): Secondary | ICD-10-CM | POA: Diagnosis not present

## 2020-06-06 DIAGNOSIS — M25562 Pain in left knee: Secondary | ICD-10-CM | POA: Diagnosis not present

## 2020-06-06 DIAGNOSIS — R262 Difficulty in walking, not elsewhere classified: Secondary | ICD-10-CM | POA: Diagnosis not present

## 2020-06-06 DIAGNOSIS — M6281 Muscle weakness (generalized): Secondary | ICD-10-CM | POA: Diagnosis not present

## 2020-06-06 DIAGNOSIS — M25662 Stiffness of left knee, not elsewhere classified: Secondary | ICD-10-CM | POA: Diagnosis not present

## 2020-06-08 DIAGNOSIS — M25662 Stiffness of left knee, not elsewhere classified: Secondary | ICD-10-CM | POA: Diagnosis not present

## 2020-06-08 DIAGNOSIS — E7800 Pure hypercholesterolemia, unspecified: Secondary | ICD-10-CM | POA: Diagnosis not present

## 2020-06-08 DIAGNOSIS — R262 Difficulty in walking, not elsewhere classified: Secondary | ICD-10-CM | POA: Diagnosis not present

## 2020-06-08 DIAGNOSIS — E039 Hypothyroidism, unspecified: Secondary | ICD-10-CM | POA: Diagnosis not present

## 2020-06-08 DIAGNOSIS — C61 Malignant neoplasm of prostate: Secondary | ICD-10-CM | POA: Diagnosis not present

## 2020-06-08 DIAGNOSIS — M25562 Pain in left knee: Secondary | ICD-10-CM | POA: Diagnosis not present

## 2020-06-08 DIAGNOSIS — M6281 Muscle weakness (generalized): Secondary | ICD-10-CM | POA: Diagnosis not present

## 2020-06-08 DIAGNOSIS — J4531 Mild persistent asthma with (acute) exacerbation: Secondary | ICD-10-CM | POA: Diagnosis not present

## 2020-06-08 DIAGNOSIS — I10 Essential (primary) hypertension: Secondary | ICD-10-CM | POA: Diagnosis not present

## 2020-06-08 DIAGNOSIS — M1712 Unilateral primary osteoarthritis, left knee: Secondary | ICD-10-CM | POA: Diagnosis not present

## 2020-06-08 DIAGNOSIS — I2510 Atherosclerotic heart disease of native coronary artery without angina pectoris: Secondary | ICD-10-CM | POA: Diagnosis not present

## 2020-06-08 DIAGNOSIS — J4520 Mild intermittent asthma, uncomplicated: Secondary | ICD-10-CM | POA: Diagnosis not present

## 2020-06-10 DIAGNOSIS — M25562 Pain in left knee: Secondary | ICD-10-CM | POA: Diagnosis not present

## 2020-06-10 DIAGNOSIS — R262 Difficulty in walking, not elsewhere classified: Secondary | ICD-10-CM | POA: Diagnosis not present

## 2020-06-10 DIAGNOSIS — M25662 Stiffness of left knee, not elsewhere classified: Secondary | ICD-10-CM | POA: Diagnosis not present

## 2020-06-10 DIAGNOSIS — M6281 Muscle weakness (generalized): Secondary | ICD-10-CM | POA: Diagnosis not present

## 2020-06-12 DIAGNOSIS — M6281 Muscle weakness (generalized): Secondary | ICD-10-CM | POA: Diagnosis not present

## 2020-06-12 DIAGNOSIS — M25562 Pain in left knee: Secondary | ICD-10-CM | POA: Diagnosis not present

## 2020-06-12 DIAGNOSIS — M25662 Stiffness of left knee, not elsewhere classified: Secondary | ICD-10-CM | POA: Diagnosis not present

## 2020-06-12 DIAGNOSIS — R262 Difficulty in walking, not elsewhere classified: Secondary | ICD-10-CM | POA: Diagnosis not present

## 2020-06-14 DIAGNOSIS — Z08 Encounter for follow-up examination after completed treatment for malignant neoplasm: Secondary | ICD-10-CM | POA: Diagnosis not present

## 2020-06-14 DIAGNOSIS — M25662 Stiffness of left knee, not elsewhere classified: Secondary | ICD-10-CM | POA: Diagnosis not present

## 2020-06-14 DIAGNOSIS — M6281 Muscle weakness (generalized): Secondary | ICD-10-CM | POA: Diagnosis not present

## 2020-06-14 DIAGNOSIS — M25562 Pain in left knee: Secondary | ICD-10-CM | POA: Diagnosis not present

## 2020-06-14 DIAGNOSIS — Z85828 Personal history of other malignant neoplasm of skin: Secondary | ICD-10-CM | POA: Diagnosis not present

## 2020-06-14 DIAGNOSIS — R262 Difficulty in walking, not elsewhere classified: Secondary | ICD-10-CM | POA: Diagnosis not present

## 2020-06-21 DIAGNOSIS — R262 Difficulty in walking, not elsewhere classified: Secondary | ICD-10-CM | POA: Diagnosis not present

## 2020-06-21 DIAGNOSIS — M6281 Muscle weakness (generalized): Secondary | ICD-10-CM | POA: Diagnosis not present

## 2020-06-21 DIAGNOSIS — M25562 Pain in left knee: Secondary | ICD-10-CM | POA: Diagnosis not present

## 2020-06-21 DIAGNOSIS — M25662 Stiffness of left knee, not elsewhere classified: Secondary | ICD-10-CM | POA: Diagnosis not present

## 2020-06-24 DIAGNOSIS — R262 Difficulty in walking, not elsewhere classified: Secondary | ICD-10-CM | POA: Diagnosis not present

## 2020-06-24 DIAGNOSIS — M25662 Stiffness of left knee, not elsewhere classified: Secondary | ICD-10-CM | POA: Diagnosis not present

## 2020-06-24 DIAGNOSIS — M25562 Pain in left knee: Secondary | ICD-10-CM | POA: Diagnosis not present

## 2020-06-24 DIAGNOSIS — M6281 Muscle weakness (generalized): Secondary | ICD-10-CM | POA: Diagnosis not present

## 2020-06-27 DIAGNOSIS — M6281 Muscle weakness (generalized): Secondary | ICD-10-CM | POA: Diagnosis not present

## 2020-06-27 DIAGNOSIS — M25662 Stiffness of left knee, not elsewhere classified: Secondary | ICD-10-CM | POA: Diagnosis not present

## 2020-06-27 DIAGNOSIS — R262 Difficulty in walking, not elsewhere classified: Secondary | ICD-10-CM | POA: Diagnosis not present

## 2020-06-27 DIAGNOSIS — M25562 Pain in left knee: Secondary | ICD-10-CM | POA: Diagnosis not present

## 2020-07-04 DIAGNOSIS — Z03818 Encounter for observation for suspected exposure to other biological agents ruled out: Secondary | ICD-10-CM | POA: Diagnosis not present

## 2020-07-04 DIAGNOSIS — J019 Acute sinusitis, unspecified: Secondary | ICD-10-CM | POA: Diagnosis not present

## 2020-07-04 DIAGNOSIS — R059 Cough, unspecified: Secondary | ICD-10-CM | POA: Diagnosis not present

## 2020-07-04 DIAGNOSIS — M6281 Muscle weakness (generalized): Secondary | ICD-10-CM | POA: Diagnosis not present

## 2020-07-04 DIAGNOSIS — J22 Unspecified acute lower respiratory infection: Secondary | ICD-10-CM | POA: Diagnosis not present

## 2020-07-04 DIAGNOSIS — M25562 Pain in left knee: Secondary | ICD-10-CM | POA: Diagnosis not present

## 2020-07-04 DIAGNOSIS — R262 Difficulty in walking, not elsewhere classified: Secondary | ICD-10-CM | POA: Diagnosis not present

## 2020-07-04 DIAGNOSIS — M25662 Stiffness of left knee, not elsewhere classified: Secondary | ICD-10-CM | POA: Diagnosis not present

## 2020-07-06 DIAGNOSIS — R262 Difficulty in walking, not elsewhere classified: Secondary | ICD-10-CM | POA: Diagnosis not present

## 2020-07-06 DIAGNOSIS — M25662 Stiffness of left knee, not elsewhere classified: Secondary | ICD-10-CM | POA: Diagnosis not present

## 2020-07-06 DIAGNOSIS — M6281 Muscle weakness (generalized): Secondary | ICD-10-CM | POA: Diagnosis not present

## 2020-07-06 DIAGNOSIS — M25562 Pain in left knee: Secondary | ICD-10-CM | POA: Diagnosis not present

## 2020-07-07 DIAGNOSIS — M1712 Unilateral primary osteoarthritis, left knee: Secondary | ICD-10-CM | POA: Diagnosis not present

## 2020-07-07 DIAGNOSIS — E7800 Pure hypercholesterolemia, unspecified: Secondary | ICD-10-CM | POA: Diagnosis not present

## 2020-07-07 DIAGNOSIS — J4520 Mild intermittent asthma, uncomplicated: Secondary | ICD-10-CM | POA: Diagnosis not present

## 2020-07-07 DIAGNOSIS — J4531 Mild persistent asthma with (acute) exacerbation: Secondary | ICD-10-CM | POA: Diagnosis not present

## 2020-07-07 DIAGNOSIS — I2510 Atherosclerotic heart disease of native coronary artery without angina pectoris: Secondary | ICD-10-CM | POA: Diagnosis not present

## 2020-07-07 DIAGNOSIS — C61 Malignant neoplasm of prostate: Secondary | ICD-10-CM | POA: Diagnosis not present

## 2020-07-07 DIAGNOSIS — I10 Essential (primary) hypertension: Secondary | ICD-10-CM | POA: Diagnosis not present

## 2020-07-07 DIAGNOSIS — E039 Hypothyroidism, unspecified: Secondary | ICD-10-CM | POA: Diagnosis not present

## 2020-07-11 DIAGNOSIS — R262 Difficulty in walking, not elsewhere classified: Secondary | ICD-10-CM | POA: Diagnosis not present

## 2020-07-11 DIAGNOSIS — M6281 Muscle weakness (generalized): Secondary | ICD-10-CM | POA: Diagnosis not present

## 2020-07-11 DIAGNOSIS — M25662 Stiffness of left knee, not elsewhere classified: Secondary | ICD-10-CM | POA: Diagnosis not present

## 2020-07-11 DIAGNOSIS — M25562 Pain in left knee: Secondary | ICD-10-CM | POA: Diagnosis not present

## 2020-07-13 DIAGNOSIS — M6281 Muscle weakness (generalized): Secondary | ICD-10-CM | POA: Diagnosis not present

## 2020-07-13 DIAGNOSIS — M25562 Pain in left knee: Secondary | ICD-10-CM | POA: Diagnosis not present

## 2020-07-13 DIAGNOSIS — R262 Difficulty in walking, not elsewhere classified: Secondary | ICD-10-CM | POA: Diagnosis not present

## 2020-07-13 DIAGNOSIS — M25662 Stiffness of left knee, not elsewhere classified: Secondary | ICD-10-CM | POA: Diagnosis not present

## 2020-08-01 DIAGNOSIS — Z0000 Encounter for general adult medical examination without abnormal findings: Secondary | ICD-10-CM | POA: Diagnosis not present

## 2020-08-01 DIAGNOSIS — Z1389 Encounter for screening for other disorder: Secondary | ICD-10-CM | POA: Diagnosis not present

## 2020-08-01 DIAGNOSIS — Z23 Encounter for immunization: Secondary | ICD-10-CM | POA: Diagnosis not present

## 2020-08-10 DIAGNOSIS — I2510 Atherosclerotic heart disease of native coronary artery without angina pectoris: Secondary | ICD-10-CM | POA: Diagnosis not present

## 2020-08-10 DIAGNOSIS — E039 Hypothyroidism, unspecified: Secondary | ICD-10-CM | POA: Diagnosis not present

## 2020-08-10 DIAGNOSIS — C61 Malignant neoplasm of prostate: Secondary | ICD-10-CM | POA: Diagnosis not present

## 2020-08-10 DIAGNOSIS — I10 Essential (primary) hypertension: Secondary | ICD-10-CM | POA: Diagnosis not present

## 2020-08-10 DIAGNOSIS — M1712 Unilateral primary osteoarthritis, left knee: Secondary | ICD-10-CM | POA: Diagnosis not present

## 2020-08-10 DIAGNOSIS — J4531 Mild persistent asthma with (acute) exacerbation: Secondary | ICD-10-CM | POA: Diagnosis not present

## 2020-08-10 DIAGNOSIS — E7800 Pure hypercholesterolemia, unspecified: Secondary | ICD-10-CM | POA: Diagnosis not present

## 2020-08-10 DIAGNOSIS — J4520 Mild intermittent asthma, uncomplicated: Secondary | ICD-10-CM | POA: Diagnosis not present

## 2020-08-23 DIAGNOSIS — Z96652 Presence of left artificial knee joint: Secondary | ICD-10-CM | POA: Diagnosis not present

## 2020-08-30 DIAGNOSIS — E669 Obesity, unspecified: Secondary | ICD-10-CM | POA: Diagnosis not present

## 2020-08-30 DIAGNOSIS — M1990 Unspecified osteoarthritis, unspecified site: Secondary | ICD-10-CM | POA: Diagnosis not present

## 2020-08-30 DIAGNOSIS — I2510 Atherosclerotic heart disease of native coronary artery without angina pectoris: Secondary | ICD-10-CM | POA: Diagnosis not present

## 2020-08-30 DIAGNOSIS — J45909 Unspecified asthma, uncomplicated: Secondary | ICD-10-CM | POA: Diagnosis not present

## 2020-08-30 DIAGNOSIS — M109 Gout, unspecified: Secondary | ICD-10-CM | POA: Diagnosis not present

## 2020-08-30 DIAGNOSIS — Z008 Encounter for other general examination: Secondary | ICD-10-CM | POA: Diagnosis not present

## 2020-08-30 DIAGNOSIS — E785 Hyperlipidemia, unspecified: Secondary | ICD-10-CM | POA: Diagnosis not present

## 2020-08-30 DIAGNOSIS — K219 Gastro-esophageal reflux disease without esophagitis: Secondary | ICD-10-CM | POA: Diagnosis not present

## 2020-08-30 DIAGNOSIS — N3941 Urge incontinence: Secondary | ICD-10-CM | POA: Diagnosis not present

## 2020-08-30 DIAGNOSIS — E039 Hypothyroidism, unspecified: Secondary | ICD-10-CM | POA: Diagnosis not present

## 2020-08-30 DIAGNOSIS — I10 Essential (primary) hypertension: Secondary | ICD-10-CM | POA: Diagnosis not present

## 2020-09-08 ENCOUNTER — Other Ambulatory Visit: Payer: Self-pay | Admitting: Interventional Cardiology

## 2020-09-08 DIAGNOSIS — I2510 Atherosclerotic heart disease of native coronary artery without angina pectoris: Secondary | ICD-10-CM | POA: Diagnosis not present

## 2020-09-08 DIAGNOSIS — K219 Gastro-esophageal reflux disease without esophagitis: Secondary | ICD-10-CM | POA: Diagnosis not present

## 2020-09-08 DIAGNOSIS — E039 Hypothyroidism, unspecified: Secondary | ICD-10-CM | POA: Diagnosis not present

## 2020-09-08 DIAGNOSIS — E7800 Pure hypercholesterolemia, unspecified: Secondary | ICD-10-CM | POA: Diagnosis not present

## 2020-09-08 DIAGNOSIS — E790 Hyperuricemia without signs of inflammatory arthritis and tophaceous disease: Secondary | ICD-10-CM | POA: Diagnosis not present

## 2020-09-08 DIAGNOSIS — J4520 Mild intermittent asthma, uncomplicated: Secondary | ICD-10-CM | POA: Diagnosis not present

## 2020-09-08 DIAGNOSIS — Z96652 Presence of left artificial knee joint: Secondary | ICD-10-CM | POA: Diagnosis not present

## 2020-09-08 DIAGNOSIS — I10 Essential (primary) hypertension: Secondary | ICD-10-CM | POA: Diagnosis not present

## 2020-09-13 DIAGNOSIS — I10 Essential (primary) hypertension: Secondary | ICD-10-CM | POA: Diagnosis not present

## 2020-09-13 DIAGNOSIS — J4520 Mild intermittent asthma, uncomplicated: Secondary | ICD-10-CM | POA: Diagnosis not present

## 2020-09-13 DIAGNOSIS — J4531 Mild persistent asthma with (acute) exacerbation: Secondary | ICD-10-CM | POA: Diagnosis not present

## 2020-09-13 DIAGNOSIS — K219 Gastro-esophageal reflux disease without esophagitis: Secondary | ICD-10-CM | POA: Diagnosis not present

## 2020-09-13 DIAGNOSIS — E039 Hypothyroidism, unspecified: Secondary | ICD-10-CM | POA: Diagnosis not present

## 2020-09-13 DIAGNOSIS — C61 Malignant neoplasm of prostate: Secondary | ICD-10-CM | POA: Diagnosis not present

## 2020-09-13 DIAGNOSIS — I2510 Atherosclerotic heart disease of native coronary artery without angina pectoris: Secondary | ICD-10-CM | POA: Diagnosis not present

## 2020-09-13 DIAGNOSIS — E7800 Pure hypercholesterolemia, unspecified: Secondary | ICD-10-CM | POA: Diagnosis not present

## 2020-09-13 DIAGNOSIS — M1712 Unilateral primary osteoarthritis, left knee: Secondary | ICD-10-CM | POA: Diagnosis not present

## 2020-09-20 DIAGNOSIS — K0261 Dental caries on smooth surface limited to enamel: Secondary | ICD-10-CM | POA: Diagnosis not present

## 2020-09-23 DIAGNOSIS — H2513 Age-related nuclear cataract, bilateral: Secondary | ICD-10-CM | POA: Diagnosis not present

## 2020-09-23 DIAGNOSIS — H5203 Hypermetropia, bilateral: Secondary | ICD-10-CM | POA: Diagnosis not present

## 2020-09-23 DIAGNOSIS — H3500 Unspecified background retinopathy: Secondary | ICD-10-CM | POA: Diagnosis not present

## 2020-10-06 ENCOUNTER — Other Ambulatory Visit: Payer: Self-pay | Admitting: Interventional Cardiology

## 2020-10-06 MED ORDER — DILTIAZEM HCL ER COATED BEADS 240 MG PO CP24: 240.0000 mg | ORAL_CAPSULE | Freq: Every day | ORAL | 5 refills | Status: AC

## 2020-10-12 DIAGNOSIS — I10 Essential (primary) hypertension: Secondary | ICD-10-CM | POA: Diagnosis not present

## 2020-10-12 DIAGNOSIS — E7800 Pure hypercholesterolemia, unspecified: Secondary | ICD-10-CM | POA: Diagnosis not present

## 2020-10-12 DIAGNOSIS — K219 Gastro-esophageal reflux disease without esophagitis: Secondary | ICD-10-CM | POA: Diagnosis not present

## 2020-10-12 DIAGNOSIS — I2510 Atherosclerotic heart disease of native coronary artery without angina pectoris: Secondary | ICD-10-CM | POA: Diagnosis not present

## 2020-10-12 DIAGNOSIS — J4531 Mild persistent asthma with (acute) exacerbation: Secondary | ICD-10-CM | POA: Diagnosis not present

## 2020-10-12 DIAGNOSIS — M1712 Unilateral primary osteoarthritis, left knee: Secondary | ICD-10-CM | POA: Diagnosis not present

## 2020-10-12 DIAGNOSIS — J4520 Mild intermittent asthma, uncomplicated: Secondary | ICD-10-CM | POA: Diagnosis not present

## 2020-10-12 DIAGNOSIS — E039 Hypothyroidism, unspecified: Secondary | ICD-10-CM | POA: Diagnosis not present

## 2020-11-02 DIAGNOSIS — K0261 Dental caries on smooth surface limited to enamel: Secondary | ICD-10-CM | POA: Diagnosis not present

## 2020-11-15 DIAGNOSIS — Z86010 Personal history of colonic polyps: Secondary | ICD-10-CM | POA: Diagnosis not present

## 2020-11-15 DIAGNOSIS — R197 Diarrhea, unspecified: Secondary | ICD-10-CM | POA: Diagnosis not present

## 2020-12-07 DIAGNOSIS — I2510 Atherosclerotic heart disease of native coronary artery without angina pectoris: Secondary | ICD-10-CM | POA: Diagnosis not present

## 2020-12-07 DIAGNOSIS — E039 Hypothyroidism, unspecified: Secondary | ICD-10-CM | POA: Diagnosis not present

## 2020-12-07 DIAGNOSIS — I10 Essential (primary) hypertension: Secondary | ICD-10-CM | POA: Diagnosis not present

## 2020-12-07 DIAGNOSIS — J4531 Mild persistent asthma with (acute) exacerbation: Secondary | ICD-10-CM | POA: Diagnosis not present

## 2020-12-07 DIAGNOSIS — K219 Gastro-esophageal reflux disease without esophagitis: Secondary | ICD-10-CM | POA: Diagnosis not present

## 2020-12-07 DIAGNOSIS — E7800 Pure hypercholesterolemia, unspecified: Secondary | ICD-10-CM | POA: Diagnosis not present

## 2020-12-07 DIAGNOSIS — J4520 Mild intermittent asthma, uncomplicated: Secondary | ICD-10-CM | POA: Diagnosis not present

## 2020-12-07 DIAGNOSIS — C61 Malignant neoplasm of prostate: Secondary | ICD-10-CM | POA: Diagnosis not present

## 2020-12-07 DIAGNOSIS — M1712 Unilateral primary osteoarthritis, left knee: Secondary | ICD-10-CM | POA: Diagnosis not present

## 2021-01-09 DIAGNOSIS — E039 Hypothyroidism, unspecified: Secondary | ICD-10-CM | POA: Diagnosis not present

## 2021-01-09 DIAGNOSIS — C61 Malignant neoplasm of prostate: Secondary | ICD-10-CM | POA: Diagnosis not present

## 2021-01-09 DIAGNOSIS — I10 Essential (primary) hypertension: Secondary | ICD-10-CM | POA: Diagnosis not present

## 2021-01-09 DIAGNOSIS — K219 Gastro-esophageal reflux disease without esophagitis: Secondary | ICD-10-CM | POA: Diagnosis not present

## 2021-01-09 DIAGNOSIS — J4531 Mild persistent asthma with (acute) exacerbation: Secondary | ICD-10-CM | POA: Diagnosis not present

## 2021-01-09 DIAGNOSIS — I2510 Atherosclerotic heart disease of native coronary artery without angina pectoris: Secondary | ICD-10-CM | POA: Diagnosis not present

## 2021-01-09 DIAGNOSIS — E7800 Pure hypercholesterolemia, unspecified: Secondary | ICD-10-CM | POA: Diagnosis not present

## 2021-01-16 DIAGNOSIS — D125 Benign neoplasm of sigmoid colon: Secondary | ICD-10-CM | POA: Diagnosis not present

## 2021-01-16 DIAGNOSIS — Z86010 Personal history of colonic polyps: Secondary | ICD-10-CM | POA: Diagnosis not present

## 2021-01-16 DIAGNOSIS — K5730 Diverticulosis of large intestine without perforation or abscess without bleeding: Secondary | ICD-10-CM | POA: Diagnosis not present

## 2021-01-16 DIAGNOSIS — K640 First degree hemorrhoids: Secondary | ICD-10-CM | POA: Diagnosis not present

## 2021-01-17 DIAGNOSIS — J4531 Mild persistent asthma with (acute) exacerbation: Secondary | ICD-10-CM | POA: Diagnosis not present

## 2021-01-17 DIAGNOSIS — C61 Malignant neoplasm of prostate: Secondary | ICD-10-CM | POA: Diagnosis not present

## 2021-01-17 DIAGNOSIS — K219 Gastro-esophageal reflux disease without esophagitis: Secondary | ICD-10-CM | POA: Diagnosis not present

## 2021-01-17 DIAGNOSIS — J4520 Mild intermittent asthma, uncomplicated: Secondary | ICD-10-CM | POA: Diagnosis not present

## 2021-01-17 DIAGNOSIS — I10 Essential (primary) hypertension: Secondary | ICD-10-CM | POA: Diagnosis not present

## 2021-01-17 DIAGNOSIS — M1712 Unilateral primary osteoarthritis, left knee: Secondary | ICD-10-CM | POA: Diagnosis not present

## 2021-01-17 DIAGNOSIS — I2510 Atherosclerotic heart disease of native coronary artery without angina pectoris: Secondary | ICD-10-CM | POA: Diagnosis not present

## 2021-01-17 DIAGNOSIS — E7800 Pure hypercholesterolemia, unspecified: Secondary | ICD-10-CM | POA: Diagnosis not present

## 2021-01-17 DIAGNOSIS — E039 Hypothyroidism, unspecified: Secondary | ICD-10-CM | POA: Diagnosis not present

## 2021-01-18 DIAGNOSIS — D125 Benign neoplasm of sigmoid colon: Secondary | ICD-10-CM | POA: Diagnosis not present

## 2021-03-04 ENCOUNTER — Other Ambulatory Visit: Payer: Self-pay | Admitting: Interventional Cardiology

## 2021-03-09 DIAGNOSIS — Z8616 Personal history of COVID-19: Secondary | ICD-10-CM | POA: Diagnosis not present

## 2021-03-21 DIAGNOSIS — J029 Acute pharyngitis, unspecified: Secondary | ICD-10-CM | POA: Diagnosis not present

## 2021-03-28 DIAGNOSIS — J019 Acute sinusitis, unspecified: Secondary | ICD-10-CM | POA: Diagnosis not present

## 2021-04-27 DIAGNOSIS — M1712 Unilateral primary osteoarthritis, left knee: Secondary | ICD-10-CM | POA: Diagnosis not present

## 2021-04-27 DIAGNOSIS — J4520 Mild intermittent asthma, uncomplicated: Secondary | ICD-10-CM | POA: Diagnosis not present

## 2021-04-27 DIAGNOSIS — K219 Gastro-esophageal reflux disease without esophagitis: Secondary | ICD-10-CM | POA: Diagnosis not present

## 2021-04-27 DIAGNOSIS — I10 Essential (primary) hypertension: Secondary | ICD-10-CM | POA: Diagnosis not present

## 2021-04-27 DIAGNOSIS — E039 Hypothyroidism, unspecified: Secondary | ICD-10-CM | POA: Diagnosis not present

## 2021-04-27 DIAGNOSIS — I2510 Atherosclerotic heart disease of native coronary artery without angina pectoris: Secondary | ICD-10-CM | POA: Diagnosis not present

## 2021-04-27 DIAGNOSIS — E7800 Pure hypercholesterolemia, unspecified: Secondary | ICD-10-CM | POA: Diagnosis not present

## 2021-04-27 DIAGNOSIS — J4531 Mild persistent asthma with (acute) exacerbation: Secondary | ICD-10-CM | POA: Diagnosis not present

## 2021-05-04 ENCOUNTER — Other Ambulatory Visit (HOSPITAL_BASED_OUTPATIENT_CLINIC_OR_DEPARTMENT_OTHER): Payer: Self-pay

## 2021-05-04 ENCOUNTER — Ambulatory Visit: Payer: Medicare HMO | Attending: Internal Medicine

## 2021-05-04 DIAGNOSIS — Z23 Encounter for immunization: Secondary | ICD-10-CM

## 2021-05-04 MED ORDER — INFLUENZA VAC A&B SA ADJ QUAD 0.5 ML IM PRSY
PREFILLED_SYRINGE | INTRAMUSCULAR | 0 refills | Status: DC
  Filled 2021-05-04: qty 0.5, 1d supply, fill #0

## 2021-05-04 NOTE — Progress Notes (Signed)
   Covid-19 Vaccination Clinic  Name:  Jorge Morrison    MRN: [id number] DOB: 06-11-1946  05/04/2021  Mr. Knisley was observed post Covid-19 immunization for 15 minutes without incident. He was provided with Vaccine Information Sheet and instruction to access the V-Safe system.   Mr. Cayer was instructed to call 911 with any severe reactions post vaccine: Difficulty breathing  Swelling of face and throat  A fast heartbeat  A bad rash all over body  Dizziness and weakness

## 2021-05-26 ENCOUNTER — Other Ambulatory Visit (HOSPITAL_BASED_OUTPATIENT_CLINIC_OR_DEPARTMENT_OTHER): Payer: Self-pay

## 2021-05-26 MED ORDER — MODERNA COVID-19 BIVAL BOOSTER 50 MCG/0.5ML IM SUSP
INTRAMUSCULAR | 0 refills | Status: DC
  Filled 2021-05-26: qty 0.5, 1d supply, fill #0

## 2021-06-01 DIAGNOSIS — Z96652 Presence of left artificial knee joint: Secondary | ICD-10-CM | POA: Diagnosis not present

## 2021-06-02 DIAGNOSIS — E039 Hypothyroidism, unspecified: Secondary | ICD-10-CM | POA: Diagnosis not present

## 2021-06-02 DIAGNOSIS — I2510 Atherosclerotic heart disease of native coronary artery without angina pectoris: Secondary | ICD-10-CM | POA: Diagnosis not present

## 2021-06-02 DIAGNOSIS — J4520 Mild intermittent asthma, uncomplicated: Secondary | ICD-10-CM | POA: Diagnosis not present

## 2021-06-02 DIAGNOSIS — K219 Gastro-esophageal reflux disease without esophagitis: Secondary | ICD-10-CM | POA: Diagnosis not present

## 2021-06-02 DIAGNOSIS — E7800 Pure hypercholesterolemia, unspecified: Secondary | ICD-10-CM | POA: Diagnosis not present

## 2021-06-02 DIAGNOSIS — I10 Essential (primary) hypertension: Secondary | ICD-10-CM | POA: Diagnosis not present

## 2021-06-02 DIAGNOSIS — J4531 Mild persistent asthma with (acute) exacerbation: Secondary | ICD-10-CM | POA: Diagnosis not present

## 2021-06-30 DIAGNOSIS — L570 Actinic keratosis: Secondary | ICD-10-CM | POA: Diagnosis not present

## 2021-06-30 DIAGNOSIS — L0100 Impetigo, unspecified: Secondary | ICD-10-CM | POA: Diagnosis not present

## 2021-06-30 DIAGNOSIS — D0439 Carcinoma in situ of skin of other parts of face: Secondary | ICD-10-CM | POA: Diagnosis not present

## 2021-06-30 DIAGNOSIS — B350 Tinea barbae and tinea capitis: Secondary | ICD-10-CM | POA: Diagnosis not present

## 2021-06-30 DIAGNOSIS — X32XXXD Exposure to sunlight, subsequent encounter: Secondary | ICD-10-CM | POA: Diagnosis not present

## 2021-07-27 DIAGNOSIS — E039 Hypothyroidism, unspecified: Secondary | ICD-10-CM | POA: Diagnosis not present

## 2021-07-27 DIAGNOSIS — D696 Thrombocytopenia, unspecified: Secondary | ICD-10-CM | POA: Diagnosis not present

## 2021-07-27 DIAGNOSIS — E7800 Pure hypercholesterolemia, unspecified: Secondary | ICD-10-CM | POA: Diagnosis not present

## 2021-07-27 DIAGNOSIS — Z8546 Personal history of malignant neoplasm of prostate: Secondary | ICD-10-CM | POA: Diagnosis not present

## 2021-07-27 DIAGNOSIS — E790 Hyperuricemia without signs of inflammatory arthritis and tophaceous disease: Secondary | ICD-10-CM | POA: Diagnosis not present

## 2021-08-02 DIAGNOSIS — Z1389 Encounter for screening for other disorder: Secondary | ICD-10-CM | POA: Diagnosis not present

## 2021-08-02 DIAGNOSIS — Z0000 Encounter for general adult medical examination without abnormal findings: Secondary | ICD-10-CM | POA: Diagnosis not present

## 2021-08-03 DIAGNOSIS — M109 Gout, unspecified: Secondary | ICD-10-CM | POA: Diagnosis not present

## 2021-08-03 DIAGNOSIS — E7800 Pure hypercholesterolemia, unspecified: Secondary | ICD-10-CM | POA: Diagnosis not present

## 2021-08-03 DIAGNOSIS — E039 Hypothyroidism, unspecified: Secondary | ICD-10-CM | POA: Diagnosis not present

## 2021-08-03 DIAGNOSIS — J4520 Mild intermittent asthma, uncomplicated: Secondary | ICD-10-CM | POA: Diagnosis not present

## 2021-08-10 DIAGNOSIS — K036 Deposits [accretions] on teeth: Secondary | ICD-10-CM | POA: Diagnosis not present

## 2021-08-11 DIAGNOSIS — C44320 Squamous cell carcinoma of skin of unspecified parts of face: Secondary | ICD-10-CM | POA: Diagnosis not present

## 2021-08-11 DIAGNOSIS — X32XXXD Exposure to sunlight, subsequent encounter: Secondary | ICD-10-CM | POA: Diagnosis not present

## 2021-08-11 DIAGNOSIS — Z08 Encounter for follow-up examination after completed treatment for malignant neoplasm: Secondary | ICD-10-CM | POA: Diagnosis not present

## 2021-08-11 DIAGNOSIS — L820 Inflamed seborrheic keratosis: Secondary | ICD-10-CM | POA: Diagnosis not present

## 2021-08-11 DIAGNOSIS — L570 Actinic keratosis: Secondary | ICD-10-CM | POA: Diagnosis not present

## 2021-08-11 DIAGNOSIS — Z85828 Personal history of other malignant neoplasm of skin: Secondary | ICD-10-CM | POA: Diagnosis not present

## 2021-09-11 DIAGNOSIS — K036 Deposits [accretions] on teeth: Secondary | ICD-10-CM | POA: Diagnosis not present

## 2021-09-25 DIAGNOSIS — H02831 Dermatochalasis of right upper eyelid: Secondary | ICD-10-CM | POA: Diagnosis not present

## 2021-09-25 DIAGNOSIS — H25813 Combined forms of age-related cataract, bilateral: Secondary | ICD-10-CM | POA: Diagnosis not present

## 2021-09-25 DIAGNOSIS — H02834 Dermatochalasis of left upper eyelid: Secondary | ICD-10-CM | POA: Diagnosis not present

## 2021-09-25 DIAGNOSIS — H52203 Unspecified astigmatism, bilateral: Secondary | ICD-10-CM | POA: Diagnosis not present

## 2021-09-27 DIAGNOSIS — K036 Deposits [accretions] on teeth: Secondary | ICD-10-CM | POA: Diagnosis not present

## 2021-10-06 DIAGNOSIS — D0439 Carcinoma in situ of skin of other parts of face: Secondary | ICD-10-CM | POA: Diagnosis not present

## 2021-10-06 DIAGNOSIS — L570 Actinic keratosis: Secondary | ICD-10-CM | POA: Diagnosis not present

## 2021-10-06 DIAGNOSIS — X32XXXD Exposure to sunlight, subsequent encounter: Secondary | ICD-10-CM | POA: Diagnosis not present

## 2021-11-13 NOTE — Progress Notes (Signed)
?Cardiology Office Note:   ? ?Date:  11/14/2021  ? ?ID:  Jorge Morrison, DOB [birth year], MRN [id number] ? ?PCP:  [name], MD  ?Cardiologist:  Sinclair Grooms, MD  ? ?Referring MD: [name], MD  ? ?Chief Complaint  ?Patient presents with  ? Coronary Artery Disease  ? Hypertension  ? ? ?History of Present Illness:   ? ?Jorge Morrison is a 76 y.o. 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 ? ? ?He is doing well.  He continues to have fleeting chest pressure in a very localized region under the left nipple.  These episodes last less than 5 seconds and are not exertion related.  No other associated symptoms. ? ?Started working at the [employer].  Gets on average greater than 30 minutes of activity 5 out of 7 days of the week.  Denies orthopnea, PND, and angina.  No lower extremity swelling. ? ?Past Medical History:  ?Diagnosis Date  ? Chest pain 09/09/2018  ? Dysrhythmia   ? treated fr SVT 11/2018, started on medication  ? Elevated troponin 09/10/2018  ? GERD (gastroesophageal reflux disease)   ? Gout 09/10/2018  ? High cholesterol   ? HLD (hyperlipidemia) 09/10/2018  ? Hypertension   ? Hypothyroidism 09/10/2018  ? Prostate cancer ([place])   ? SVT (supraventricular tachycardia) ([place]) 09/10/2018  ? Thyroid disease   ? ? ?Past Surgical History:  ?Procedure Laterality Date  ? APPENDECTOMY    ? CHOLECYSTECTOMY N/A 02/10/2019  ? Procedure: LAPAROSCOPIC CHOLECYSTECTOMY;  Surgeon: Ralene Ok, MD;  Location: [place];  Service: General;  Laterality: N/A;  ? PROSTATECTOMY    ? TONSILLECTOMY    ? ? ?Current Medications: ?Current Meds  ?Medication Sig  ? albuterol (VENTOLIN HFA) 108 (90 Base) MCG/ACT inhaler Inhale 2 puffs into the lungs every 6 (six) hours as needed for wheezing or shortness of breath.  ? allopurinol (ZYLOPRIM) 300 MG tablet Take 300 mg by mouth every evening.   ? alprostadil (EDEX) 10 MCG injection 10  mcg by Intracavitary route as needed for erectile dysfunction.   ? aspirin EC 81 MG tablet Take 81 mg by mouth daily. Swallow whole.  ? cetirizine (ZYRTEC) 10 MG tablet Take 10 mg by mouth daily as needed for allergies.  ? colchicine 0.6 MG tablet Take 0.6 mg by mouth daily as needed (for gout flare up).   ? COVID-19 mRNA bivalent vaccine, Moderna, (MODERNA COVID-19 BIVAL BOOSTER) 50 MCG/0.5ML injection Inject into the muscle.  ? diltiazem (CARTIA XT) 240 MG 24 hr capsule Take 1 capsule (240 mg total) by mouth daily.  ? EPINEPHrine 0.3 mg/0.3 mL IJ SOAJ injection Inject 0.3 mg into the muscle once as needed for anaphylaxis.   ? ezetimibe (ZETIA) 10 MG tablet Take 10 mg by mouth daily.  ? fluticasone (FLONASE) 50 MCG/ACT nasal spray Place 1 spray into both nostrils 2 (two) times daily as needed for allergies or rhinitis.  ? GLUCOSAMINE-CHONDROITIN-MSM PO Take 1 tablet by mouth daily.   ? Glycerin-Hypromellose-PEG 400 ([employer] DRY EYE RELIEF OP) Place 2 drops into both eyes daily as needed (for dry eyes).  ? influenza vaccine adjuvanted (FLUAD) 0.5 ML injection Inject into the muscle.  ? isosorbide mononitrate (IMDUR) 60 MG 24 hr tablet Take 1 tablet by mouth as needed.  ? levothyroxine (SYNTHROID) 200 MCG tablet Take 200 mcg by mouth every morning.  ? losartan (COZAAR)  100 MG tablet Take 100 mg by mouth daily.  ? Omega-3 Krill Oil 500 MG CAPS Take 500 mg by mouth every evening.   ? omeprazole (PRILOSEC) 20 MG capsule Take 20 mg by mouth daily.  ? Polyethyl Glycol-Propyl Glycol (SYSTANE ULTRA) 0.4-0.3 % SOLN Place 1 drop into both eyes daily as needed (for dry eyes).  ? Probiotic Product (PROBIOTIC PO) Take 1 capsule by mouth daily.  ? rosuvastatin (CRESTOR) 40 MG tablet Take 40 mg by mouth daily.  ? sodium chloride (OCEAN) 0.65 % nasal spray Place 1 spray into the nose daily as needed for congestion.  ? Zinc 50 MG CAPS Take 50 mg by mouth daily as needed (for cold).   ? [DISCONTINUED] hydrochlorothiazide (HYDRODIURIL)  12.5 MG tablet Take 1 tablet (12.5 mg total) by mouth daily.  ?  ? ?Allergies:   Other, Gramineae pollens, and Atorvastatin  ? ?Social History  ? ?Socioeconomic History  ? Marital status: Married  ?  Spouse name: Not on file  ? Number of children: Not on file  ? Years of education: Not on file  ? Highest education level: Not on file  ?Occupational History  ? Not on file  ?Tobacco Use  ? Smoking status: Former  ? Smokeless tobacco: Never  ?Vaping Use  ? Vaping Use: Never used  ?Substance and Sexual Activity  ? Alcohol use: Yes  ?  Comment: daily  ? Drug use: No  ? Sexual activity: Not on file  ?Other Topics Concern  ? Not on file  ?Social History Narrative  ? Not on file  ? ?Social Determinants of Health  ? ?Financial Resource Strain: Not on file  ?Food Insecurity: Not on file  ?Transportation Needs: Not on file  ?Physical Activity: Not on file  ?Stress: Not on file  ?Social Connections: Not on file  ?  ? ?Family History: ?The patient's family history includes CAD in his brother and mother. ? ?ROS:   ?Please see the history of present illness.    ?He is concerned about his blood pressures.  He brought in a log of blood pressures and generally is running above 140/90.  He drinks alcohol every day.  He does not control sodium intake.  All other systems reviewed and are negative. ? ?EKGs/Labs/Other Studies Reviewed:   ? ?The following studies were reviewed today: ? ?COR CTA 01/13/2020: ?IMPRESSION: ?1. Coronary calcium score of 1960. This was 92nd percentile for age ?and sex matched control. ?  ?2. Normal coronary origin with right dominance. ?  ?3. There are diffuse calcifications in all three coronary ?distributions. ?  ?4.  There is at least moderate (50-69%) stenosis in the LAD and D 1. ?  ?5.  Mild calcification of the aortic valve and descending aorta. ?  ?6.  Will send study for FFR. ?  ?7. Recommend aggressive risk factor modification, including LDL ?reduction to <70. ? ?CT FFR 12/2019: ?  ?1. Left Main:FFRct  0.99 ?  ?2. LAD: FFRct 0.95 proximally, 0.83 mid, 0.71 distal to D2. FFRct ?0.93 in D1 and 0.83 in D2. ?  ?3. LCX: FFRct 0.99 proximal, 0.97 mid ?  ?4. RCA: FFRct 0.99 proximal, 0.93 distal ?  ?IMPRESSION: ?1.  FFRct results are abnormal in the distal LAD. ?  ?2. Findings are consistent with significant obstruction in the LAD ?distal to D2. ?  ?3. Recommend aggressive medical management and consideration for ?cardiac catheterization if clinically indicated. ?  ? ?EKG:  EKG normal sinus rhythm, with first-degree  AV block at 208 ms.  Early QRS transition.  No significant change compared to prior tracing from December 25, 2019. ? ?Recent Labs: ?No results found for requested labs within last 8760 hours.  ?Recent Lipid Panel ?No results found for: CHOL, TRIG, HDL, CHOLHDL, VLDL, LDLCALC, LDLDIRECT ? ?Physical Exam:   ? ?VS:  BP 132/78   Pulse 63   Ht '6\' 3"'$  (1.905 m)   Wt 263 lb 12.8 oz (119.7 kg)   SpO2 96%   BMI 32.97 kg/m?    ? ?Wt Readings from Last 3 Encounters:  ?11/14/21 263 lb 12.8 oz (119.7 kg)  ?02/11/20 (!) 261 lb 12.8 oz (118.8 kg)  ?12/24/19 262 lb (118.8 kg)  ?  ? ?GEN: Overweight. No acute distress ?HEENT: Normal ?NECK: No JVD. ?LYMPHATICS: No lymphadenopathy ?CARDIAC: No murmur. RRR no gallop, or edema. ?VASCULAR:  Normal Pulses. No bruits. ?RESPIRATORY:  Clear to auscultation without rales, wheezing or rhonchi  ?ABDOMEN: Soft, non-tender, non-distended, No pulsatile mass, ?MUSCULOSKELETAL: No deformity  ?SKIN: Warm and dry ?NEUROLOGIC:  Alert and oriented x 3 ?PSYCHIATRIC:  Normal affect  ? ?ASSESSMENT:   ? ?1. CAD in native artery   ?2. SVT (supraventricular tachycardia) ([place])   ?3. Mixed hyperlipidemia   ?4. Essential hypertension   ? ?PLAN:   ? ?In order of problems listed above: ? ?Stable without angina.  We should take Imdur off of his medication list.  He is not taking that medication. ?No recurrence on diltiazem. ?Continue rosuvastatin 40 mg/day.  The most recent LDL performed in January by  primary care was 12. ?Blood pressure control is not excellent.  Please see below.  Increase HCTZ to 25 mg/day.  Basic metabolic panel in 2 to 4 weeks to make sure potassium is okay and kidney function remained stable

## 2021-11-14 ENCOUNTER — Ambulatory Visit: Payer: Medicare HMO | Admitting: Interventional Cardiology

## 2021-11-14 ENCOUNTER — Encounter: Payer: Self-pay | Admitting: Interventional Cardiology

## 2021-11-14 VITALS — BP 132/78 | HR 63 | Ht 75.0 in | Wt 263.8 lb

## 2021-11-14 DIAGNOSIS — I10 Essential (primary) hypertension: Secondary | ICD-10-CM | POA: Diagnosis not present

## 2021-11-14 DIAGNOSIS — I471 Supraventricular tachycardia: Secondary | ICD-10-CM | POA: Diagnosis not present

## 2021-11-14 DIAGNOSIS — I2510 Atherosclerotic heart disease of native coronary artery without angina pectoris: Secondary | ICD-10-CM | POA: Diagnosis not present

## 2021-11-14 DIAGNOSIS — E782 Mixed hyperlipidemia: Secondary | ICD-10-CM

## 2021-11-14 MED ORDER — HYDROCHLOROTHIAZIDE 25 MG PO TABS: 25.0000 mg | ORAL_TABLET | Freq: Every day | ORAL | 3 refills | Status: DC

## 2021-11-14 NOTE — Patient Instructions (Addendum)
Medication Instructions:  ?Your physician has recommended you make the following change in your medication:  ?INCREASE: hydrochlorothiazide (HCTZ) to 25 mg by mouth once daily ? ?REMOVED: isosorbide mononitrate from your medication list  ?  ?*If you need a refill on your cardiac medications before your next appointment, please call your pharmacy* ? ? ?Lab Work: ?IN 2-4 WEEKS: BMP ?If you have labs (blood work) drawn today and your tests are completely normal, you will receive your results only by: ?MyChart Message (if you have MyChart) OR ?A paper copy in the mail ?If you have any lab test that is abnormal or we need to change your treatment, we will call you to review the results. ? ? ?Testing/Procedures: ?NONE ? ? ?Follow-Up: ?At [facility], you and your health needs are our priority.  As part of our continuing mission to provide you with exceptional heart care, we have created designated Provider Care Teams.  These Care Teams include your primary Cardiologist (physician) and Advanced Practice Providers (APPs -  Physician Assistants and Nurse Practitioners) who all work together to provide you with the care you need, when you need it. ? ? ?Your next appointment:   ?1 year(s) ? ?The format for your next appointment:   ?In Person ? ?Provider:   ?Sinclair Grooms, MD   ? ? ?Important Information About Sugar ? ? ? ? ?  ?

## 2021-11-15 DIAGNOSIS — R21 Rash and other nonspecific skin eruption: Secondary | ICD-10-CM | POA: Diagnosis not present

## 2021-11-15 DIAGNOSIS — I10 Essential (primary) hypertension: Secondary | ICD-10-CM | POA: Diagnosis not present

## 2021-11-15 DIAGNOSIS — J45909 Unspecified asthma, uncomplicated: Secondary | ICD-10-CM | POA: Diagnosis not present

## 2021-11-15 DIAGNOSIS — E785 Hyperlipidemia, unspecified: Secondary | ICD-10-CM | POA: Diagnosis not present

## 2021-11-15 DIAGNOSIS — I25119 Atherosclerotic heart disease of native coronary artery with unspecified angina pectoris: Secondary | ICD-10-CM | POA: Diagnosis not present

## 2021-11-15 DIAGNOSIS — N529 Male erectile dysfunction, unspecified: Secondary | ICD-10-CM | POA: Diagnosis not present

## 2021-11-15 DIAGNOSIS — M1990 Unspecified osteoarthritis, unspecified site: Secondary | ICD-10-CM | POA: Diagnosis not present

## 2021-11-15 DIAGNOSIS — Z6833 Body mass index (BMI) 33.0-33.9, adult: Secondary | ICD-10-CM | POA: Diagnosis not present

## 2021-11-15 DIAGNOSIS — M109 Gout, unspecified: Secondary | ICD-10-CM | POA: Diagnosis not present

## 2021-11-15 DIAGNOSIS — I4891 Unspecified atrial fibrillation: Secondary | ICD-10-CM | POA: Diagnosis not present

## 2021-11-15 DIAGNOSIS — E669 Obesity, unspecified: Secondary | ICD-10-CM | POA: Diagnosis not present

## 2021-11-15 DIAGNOSIS — K219 Gastro-esophageal reflux disease without esophagitis: Secondary | ICD-10-CM | POA: Diagnosis not present

## 2021-11-17 ENCOUNTER — Ambulatory Visit: Payer: Medicare HMO | Attending: Internal Medicine

## 2021-11-17 DIAGNOSIS — Z23 Encounter for immunization: Secondary | ICD-10-CM

## 2021-11-17 DIAGNOSIS — Z85828 Personal history of other malignant neoplasm of skin: Secondary | ICD-10-CM | POA: Diagnosis not present

## 2021-11-17 DIAGNOSIS — Z08 Encounter for follow-up examination after completed treatment for malignant neoplasm: Secondary | ICD-10-CM | POA: Diagnosis not present

## 2021-11-17 DIAGNOSIS — L570 Actinic keratosis: Secondary | ICD-10-CM | POA: Diagnosis not present

## 2021-11-17 DIAGNOSIS — X32XXXD Exposure to sunlight, subsequent encounter: Secondary | ICD-10-CM | POA: Diagnosis not present

## 2021-11-30 ENCOUNTER — Other Ambulatory Visit (HOSPITAL_BASED_OUTPATIENT_CLINIC_OR_DEPARTMENT_OTHER): Payer: Self-pay

## 2021-11-30 MED ORDER — MODERNA COVID-19 BIVAL BOOSTER 50 MCG/0.5ML IM SUSP
INTRAMUSCULAR | 0 refills | Status: DC
  Filled 2021-11-30: qty 0.5, 1d supply, fill #0

## 2021-12-11 ENCOUNTER — Other Ambulatory Visit: Payer: Medicare HMO | Admitting: *Deleted

## 2021-12-11 DIAGNOSIS — I10 Essential (primary) hypertension: Secondary | ICD-10-CM

## 2021-12-11 DIAGNOSIS — I471 Supraventricular tachycardia: Secondary | ICD-10-CM

## 2021-12-11 DIAGNOSIS — I2510 Atherosclerotic heart disease of native coronary artery without angina pectoris: Secondary | ICD-10-CM | POA: Diagnosis not present

## 2021-12-11 DIAGNOSIS — H02831 Dermatochalasis of right upper eyelid: Secondary | ICD-10-CM | POA: Diagnosis not present

## 2021-12-11 DIAGNOSIS — H25813 Combined forms of age-related cataract, bilateral: Secondary | ICD-10-CM | POA: Diagnosis not present

## 2021-12-11 DIAGNOSIS — E782 Mixed hyperlipidemia: Secondary | ICD-10-CM

## 2021-12-11 DIAGNOSIS — H02834 Dermatochalasis of left upper eyelid: Secondary | ICD-10-CM | POA: Diagnosis not present

## 2021-12-11 LAB — BASIC METABOLIC PANEL
BUN/Creatinine Ratio: 15 (ref 10–24)
BUN: 15 mg/dL (ref 8–27)
CO2: 28 mmol/L (ref 20–29)
Calcium: 9.7 mg/dL (ref 8.6–10.2)
Chloride: 102 mmol/L (ref 96–106)
Creatinine, Ser: 0.99 mg/dL (ref 0.76–1.27)
Glucose: 111 mg/dL — ABNORMAL HIGH (ref 70–99)
Potassium: 3.9 mmol/L (ref 3.5–5.2)
Sodium: 142 mmol/L (ref 134–144)
eGFR: 80 mL/min/{1.73_m2} (ref >59)

## 2021-12-21 DIAGNOSIS — K036 Deposits [accretions] on teeth: Secondary | ICD-10-CM | POA: Diagnosis not present

## 2022-01-15 DIAGNOSIS — K036 Deposits [accretions] on teeth: Secondary | ICD-10-CM | POA: Diagnosis not present

## 2022-02-20 ENCOUNTER — Encounter: Payer: Self-pay | Admitting: Interventional Cardiology

## 2022-03-30 DIAGNOSIS — M109 Gout, unspecified: Secondary | ICD-10-CM | POA: Diagnosis not present

## 2022-03-30 DIAGNOSIS — D696 Thrombocytopenia, unspecified: Secondary | ICD-10-CM | POA: Diagnosis not present

## 2022-03-30 DIAGNOSIS — Z91018 Allergy to other foods: Secondary | ICD-10-CM | POA: Diagnosis not present

## 2022-03-30 DIAGNOSIS — I10 Essential (primary) hypertension: Secondary | ICD-10-CM | POA: Diagnosis not present

## 2022-03-30 DIAGNOSIS — Z6834 Body mass index (BMI) 34.0-34.9, adult: Secondary | ICD-10-CM | POA: Diagnosis not present

## 2022-03-30 DIAGNOSIS — E039 Hypothyroidism, unspecified: Secondary | ICD-10-CM | POA: Diagnosis not present

## 2022-03-30 DIAGNOSIS — E7800 Pure hypercholesterolemia, unspecified: Secondary | ICD-10-CM | POA: Diagnosis not present

## 2022-04-12 DIAGNOSIS — E039 Hypothyroidism, unspecified: Secondary | ICD-10-CM | POA: Diagnosis not present

## 2022-04-12 DIAGNOSIS — I10 Essential (primary) hypertension: Secondary | ICD-10-CM | POA: Diagnosis not present

## 2022-04-12 DIAGNOSIS — J4531 Mild persistent asthma with (acute) exacerbation: Secondary | ICD-10-CM | POA: Diagnosis not present

## 2022-04-12 DIAGNOSIS — E7800 Pure hypercholesterolemia, unspecified: Secondary | ICD-10-CM | POA: Diagnosis not present

## 2022-04-12 DIAGNOSIS — J4520 Mild intermittent asthma, uncomplicated: Secondary | ICD-10-CM | POA: Diagnosis not present

## 2022-04-17 DIAGNOSIS — K036 Deposits [accretions] on teeth: Secondary | ICD-10-CM | POA: Diagnosis not present

## 2022-05-14 ENCOUNTER — Other Ambulatory Visit (HOSPITAL_BASED_OUTPATIENT_CLINIC_OR_DEPARTMENT_OTHER): Payer: Self-pay

## 2022-05-14 MED ORDER — FLUAD QUADRIVALENT 0.5 ML IM PRSY
PREFILLED_SYRINGE | INTRAMUSCULAR | 0 refills | Status: DC
  Filled 2022-05-14: qty 0.5, 1d supply, fill #0

## 2022-05-25 ENCOUNTER — Other Ambulatory Visit (HOSPITAL_BASED_OUTPATIENT_CLINIC_OR_DEPARTMENT_OTHER): Payer: Self-pay

## 2022-05-25 MED ORDER — COMIRNATY 30 MCG/0.3ML IM SUSY
PREFILLED_SYRINGE | INTRAMUSCULAR | 0 refills | Status: DC
  Filled 2022-05-25: qty 0.3, 1d supply, fill #0

## 2022-06-08 ENCOUNTER — Other Ambulatory Visit (HOSPITAL_BASED_OUTPATIENT_CLINIC_OR_DEPARTMENT_OTHER): Payer: Self-pay

## 2022-06-08 DIAGNOSIS — B350 Tinea barbae and tinea capitis: Secondary | ICD-10-CM | POA: Diagnosis not present

## 2022-06-08 DIAGNOSIS — L820 Inflamed seborrheic keratosis: Secondary | ICD-10-CM | POA: Diagnosis not present

## 2022-06-08 DIAGNOSIS — L570 Actinic keratosis: Secondary | ICD-10-CM | POA: Diagnosis not present

## 2022-06-08 DIAGNOSIS — X32XXXD Exposure to sunlight, subsequent encounter: Secondary | ICD-10-CM | POA: Diagnosis not present

## 2022-06-08 MED ORDER — AREXVY 120 MCG/0.5ML IM SUSR
INTRAMUSCULAR | 0 refills | Status: DC
  Filled 2022-06-08: qty 0.5, 1d supply, fill #0

## 2022-08-03 DIAGNOSIS — Z0000 Encounter for general adult medical examination without abnormal findings: Secondary | ICD-10-CM | POA: Diagnosis not present

## 2022-08-03 DIAGNOSIS — Z1389 Encounter for screening for other disorder: Secondary | ICD-10-CM | POA: Diagnosis not present

## 2022-09-27 DIAGNOSIS — D696 Thrombocytopenia, unspecified: Secondary | ICD-10-CM | POA: Diagnosis not present

## 2022-09-27 DIAGNOSIS — E039 Hypothyroidism, unspecified: Secondary | ICD-10-CM | POA: Diagnosis not present

## 2022-09-27 DIAGNOSIS — E7800 Pure hypercholesterolemia, unspecified: Secondary | ICD-10-CM | POA: Diagnosis not present

## 2022-09-27 DIAGNOSIS — Z6835 Body mass index (BMI) 35.0-35.9, adult: Secondary | ICD-10-CM | POA: Diagnosis not present

## 2022-09-27 DIAGNOSIS — I10 Essential (primary) hypertension: Secondary | ICD-10-CM | POA: Diagnosis not present

## 2022-09-27 DIAGNOSIS — M792 Neuralgia and neuritis, unspecified: Secondary | ICD-10-CM | POA: Diagnosis not present

## 2022-09-28 DIAGNOSIS — L570 Actinic keratosis: Secondary | ICD-10-CM | POA: Diagnosis not present

## 2022-09-28 DIAGNOSIS — X32XXXD Exposure to sunlight, subsequent encounter: Secondary | ICD-10-CM | POA: Diagnosis not present

## 2022-09-28 DIAGNOSIS — D0439 Carcinoma in situ of skin of other parts of face: Secondary | ICD-10-CM | POA: Diagnosis not present

## 2022-10-03 DIAGNOSIS — H02834 Dermatochalasis of left upper eyelid: Secondary | ICD-10-CM | POA: Diagnosis not present

## 2022-10-03 DIAGNOSIS — H04123 Dry eye syndrome of bilateral lacrimal glands: Secondary | ICD-10-CM | POA: Diagnosis not present

## 2022-10-03 DIAGNOSIS — H5203 Hypermetropia, bilateral: Secondary | ICD-10-CM | POA: Diagnosis not present

## 2022-10-03 DIAGNOSIS — H2513 Age-related nuclear cataract, bilateral: Secondary | ICD-10-CM | POA: Diagnosis not present

## 2022-10-03 DIAGNOSIS — H02831 Dermatochalasis of right upper eyelid: Secondary | ICD-10-CM | POA: Diagnosis not present

## 2022-10-03 DIAGNOSIS — H52203 Unspecified astigmatism, bilateral: Secondary | ICD-10-CM | POA: Diagnosis not present

## 2022-10-17 DIAGNOSIS — N5231 Erectile dysfunction following radical prostatectomy: Secondary | ICD-10-CM | POA: Diagnosis not present

## 2022-10-17 DIAGNOSIS — Z8546 Personal history of malignant neoplasm of prostate: Secondary | ICD-10-CM | POA: Diagnosis not present

## 2022-10-17 DIAGNOSIS — N3941 Urge incontinence: Secondary | ICD-10-CM | POA: Diagnosis not present

## 2022-11-20 ENCOUNTER — Other Ambulatory Visit: Payer: Self-pay

## 2022-11-20 MED ORDER — HYDROCHLOROTHIAZIDE 25 MG PO TABS: 25.0000 mg | ORAL_TABLET | Freq: Every day | ORAL | 0 refills | Status: AC

## 2022-11-27 DIAGNOSIS — H43811 Vitreous degeneration, right eye: Secondary | ICD-10-CM | POA: Diagnosis not present

## 2022-11-27 DIAGNOSIS — H2511 Age-related nuclear cataract, right eye: Secondary | ICD-10-CM | POA: Diagnosis not present

## 2022-12-05 NOTE — Progress Notes (Signed)
Cardiology Office Note:    Date:  12/06/2022   ID:  Jorge Morrison, DOB 02/08/1946, MRN [id number]  PCP:  Ileana Ladd, MD (Inactive)  Cardiologist:  Christell Constant, MD   Referring MD: No ref. provider found   CC: Transition to new cardiologist.  History of Present Illness:    Jorge Morrison is a 77 y.o. 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 2023: Seeing Dr. Katrinka Blazing; allergies to atoravastatin but tolerates rosuvastatin. 2024: on Rosuvastatin 20 mg for myalgias   Patient notes that he is doing well.   Since last visit notes that he is trying to lose a bit of weight that has come back on. Has some asthma in the spring but no SOB outside of allergy season. There are no interval hospital/ED visit.    No chest pain or pressure .  No PND/Orthopnea.  No leg swelling.  No palpitations or syncope   He is looking to get more active. He working Estate agent two days a week. Worked out once a week (elliptical for 20 minutes).  Heart rate went up to 122.   Then did 2.5 miles on the bike.   Ambulatory blood pressure 140/78.   Past Medical History:  Diagnosis Date   Chest pain 09/09/2018   Dysrhythmia    treated fr SVT 11/2018, started on medication   Elevated troponin 09/10/2018   GERD (gastroesophageal reflux disease)    Gout 09/10/2018   High cholesterol    HLD (hyperlipidemia) 09/10/2018   Hypertension    Hypothyroidism 09/10/2018   Prostate cancer (HCC)    SVT (supraventricular tachycardia) 09/10/2018   Thyroid disease     Past Surgical History:  Procedure Laterality Date   APPENDECTOMY     CHOLECYSTECTOMY N/A 02/10/2019   Procedure: LAPAROSCOPIC CHOLECYSTECTOMY;  Surgeon: Axel Filler, MD;  Location: [facility] OR;  Service: General;  Laterality: N/A;   PROSTATECTOMY     TONSILLECTOMY      Current Medications: Current Meds  Medication Sig   albuterol (VENTOLIN  HFA) 108 (90 Base) MCG/ACT inhaler Inhale 2 puffs into the lungs every 6 (six) hours as needed for wheezing or shortness of breath.   allopurinol (ZYLOPRIM) 300 MG tablet Take 300 mg by mouth every evening.    alprostadil (EDEX) 10 MCG injection 10 mcg by Intracavitary route as needed for erectile dysfunction.    aspirin EC 81 MG tablet Take 81 mg by mouth daily. Swallow whole.   cetirizine (ZYRTEC) 10 MG tablet Take 10 mg by mouth daily as needed for allergies.   colchicine 0.6 MG tablet Take 0.6 mg by mouth daily as needed (for gout flare up).    diltiazem (CARTIA XT) 240 MG 24 hr capsule Take 1 capsule (240 mg total) by mouth daily.   EPINEPHrine 0.3 mg/0.3 mL IJ SOAJ injection Inject 0.3 mg into the muscle once as needed for anaphylaxis.    ezetimibe (ZETIA) 10 MG tablet Take 10 mg by mouth daily.   fluticasone (FLONASE) 50 MCG/ACT nasal spray Place 1 spray into both nostrils 2 (two) times daily as needed for allergies or rhinitis.   GLUCOSAMINE-CHONDROITIN-MSM PO Take 1 tablet by mouth daily.    hydrochlorothiazide (HYDRODIURIL) 25 MG tablet Take 1 tablet (25 mg total) by mouth daily.   irbesartan (AVAPRO) 300 MG tablet Take 1 tablet (300 mg total) by mouth daily.   levothyroxine (SYNTHROID) 200 MCG tablet Take 200  mcg by mouth every morning.   nitroGLYCERIN (NITROSTAT) 0.4 MG SL tablet Place 1 tablet (0.4 mg total) under the tongue every 5 (five) minutes as needed for chest pain.   Omega-3 Krill Oil 500 MG CAPS Take 500 mg by mouth every evening.    omeprazole (PRILOSEC) 20 MG capsule Take 20 mg by mouth daily.   Polyethyl Glycol-Propyl Glycol (SYSTANE ULTRA) 0.4-0.3 % SOLN Place 1 drop into both eyes daily as needed (for dry eyes).   Probiotic Product (PROBIOTIC PO) Take 1 capsule by mouth daily.   rosuvastatin (CRESTOR) 40 MG tablet Take 40 mg by mouth daily.   sodium chloride (OCEAN) 0.65 % nasal spray Place 1 spray into the nose daily as needed for congestion.   Zinc 50 MG CAPS Take 50  mg by mouth daily as needed (for cold).    [DISCONTINUED] losartan (COZAAR) 100 MG tablet Take 100 mg by mouth daily.     Allergies:   Other, Gramineae pollens, and Atorvastatin   Social History   Socioeconomic History   Marital status: Married    Spouse name: Not on file   Number of children: Not on file   Years of education: Not on file   Highest education level: Not on file  Occupational History   Not on file  Tobacco Use   Smoking status: Former   Smokeless tobacco: Never  Vaping Use   Vaping Use: Never used  Substance and Sexual Activity   Alcohol use: Yes    Comment: daily   Drug use: No   Sexual activity: Not on file  Other Topics Concern   Not on file  Social History Narrative   Not on file   Social Determinants of Health   Financial Resource Strain: Not on file  Food Insecurity: Not on file  Transportation Needs: Not on file  Physical Activity: Not on file  Stress: Not on file  Social Connections: Not on file     Family History: The patient's family history includes CAD in his brother and mother.  ROS:   Please see the history of present illness.    He is concerned about his blood pressures.  He brought in a log of blood pressures and generally is running above 140/90.  He drinks alcohol every day.  He does not control sodium intake.  All other systems reviewed and are negative.  EKGs/Labs/Other Studies Reviewed:    The following studies were reviewed today:  EKG:   12/06/22: SR rate 68  2023: normal sinus rhythm, with first-degree AV block at 208 ms.  Early QRS transition.  No significant change compared to prior tracing from December 25, 2019.  Cardiac Studies & Procedures     STRESS TESTS  NM MYOCAR MULTI W/SPECT W 09/11/2018  Narrative  Blood pressure demonstrated a hypertensive response to exercise.  There was no ST segment deviation noted during stress.  No T wave inversion was noted during stress.  The study is normal.  This is a low  risk study.  The left ventricular ejection fraction is mildly decreased (45-54%).  Diaphragmatic attenuation no ischemia EF estimated 47% but visually looks normal And EF was 60-65% by echo 09/10/18   ECHOCARDIOGRAM  ECHOCARDIOGRAM COMPLETE 09/10/2018  Narrative ECHOCARDIOGRAM REPORT    Patient Name:   Jorge Morrison Date of Exam: 09/10/2018 Medical Rec #:  [id number]          Height:       75.0 in Accession #:  [id number]         Weight:       263.5 lb Date of Birth:  10-09-1945          BSA:          2.47 m Patient Age:    72 years           BP:           116/73 mmHg Patient Gender: M                  HR:           65 bpm. Exam Location:  Inpatient   Procedure: 2D Echo, Cardiac Doppler and Color Doppler  Indications:    R07.9* Chest pain, unspecified  History:        Patient has no prior history of Echocardiogram examinations. Elevated troponin, Abnormal ECG; Signs/Symptoms: Chest Pain; Risk Factors: Dyslipidemia.  Sonographer:    Sheralyn Boatman Referring Phys: 51 ARSHAD N KAKRAKANDY  IMPRESSIONS   1. The left ventricle has normal systolic function with an ejection fraction of 60-65%. The cavity size was normal. There is moderately increased left ventricular wall thickness. Left ventricular diastolic parameters were normal. 2. The right ventricle has normal systolic function. The cavity was normal. There is no increase in right ventricular wall thickness. 3. The mitral valve is normal in structure. Mild thickening of the mitral valve leaflet. Mild calcification of the mitral valve leaflet. There is mild mitral annular calcification present. 4. The tricuspid valve is normal in structure. 5. The aortic valve is tricuspid Mild thickening of the aortic valve Mild calcification of the aortic valve. 6. The pulmonic valve was normal in structure. 7. There is mild dilatation of the aortic root measuring 42 mm.  FINDINGS Left Ventricle: The left ventricle has normal systolic  function, with an ejection fraction of 60-65%. The cavity size was normal. There is moderately increased left ventricular wall thickness. Left ventricular diastolic parameters were normal Right Ventricle: The right ventricle has normal systolic function. The cavity was normal. There is no increase in right ventricular wall thickness.    Left Atrium: left atrial size was normal in size Right Atrium: right atrial size was normal in size. Right atrial pressure is estimated at 3 mmHg.  Interatrial Septum: No atrial level shunt detected by color flow Doppler. Pericardium: There is no evidence of pericardial effusion. Mitral Valve: The mitral valve is normal in structure. Mild thickening of the mitral valve leaflet. Mild calcification of the mitral valve leaflet. There is mild mitral annular calcification present. Mitral valve regurgitation is not visualized by color flow Doppler. Tricuspid Valve: The tricuspid valve is normal in structure. Tricuspid valve regurgitation was not visualized by color flow Doppler. Aortic Valve: The aortic valve is tricuspid Mild thickening of the aortic valve and Mild calcification of the aortic valve Aortic valve regurgitation was not visualized by color flow Doppler. There is no evidence of aortic valve stenosis. Pulmonic Valve: The pulmonic valve was normal in structure. Pulmonic valve regurgitation is not visualized by color flow Doppler. Aorta: There is mild dilatation of the aortic root measuring 42 mm. Venous: The inferior vena cava is normal in size with greater than 50% respiratory variability.  LEFT VENTRICLE PLAX 2D (Teich)              Biplane EF (MOD) LV EF:          51.3 %       LV Biplane EF:  71.5 % LVIDd:          4.60 cm      LV A4C EF:       67.9 % LVIDs:          3.40 cm      LV A2C EF:       75.5 % LV PW:          1.40 cm LV IVS:         1.50 cm      Diastology LVOT diam:      2.10 cm      LV e' lateral:   7.94 cm/s LV SV:          50 ml         LV E/e' lateral: 9.7 LVOT Area:      3.46 cm     LV e' medial:    6.64 cm/s LV E/e' medial:  11.6 LV Volumes (MOD) LV area d, A2C:    30.00 cm LV area d, A4C:    35.10 cm LV area s, A2C:    12.70 cm LV area s, A4C:    18.10 cm LV major d, A2C:   8.97 cm LV major d, A4C:   8.74 cm LV major s, A2C:   7.00 cm LV major s, A4C:   7.46 cm LV vol d, MOD A2C: 82.5 ml LV vol d, MOD A4C: 116.0 ml LV vol s, MOD A2C: 20.2 ml LV vol s, MOD A4C: 37.2 ml LV SV MOD A2C:     62.3 ml LV SV MOD A4C:     116.0 ml LV SV MOD BP:      70.8 ml  RIGHT VENTRICLE RV S prime:     11.40 cm/s TAPSE (M-mode): 2.6 cm  LEFT ATRIUM             Index       RIGHT ATRIUM           Index LA diam:        3.20 cm 1.30 cm/m  RA Pressure: 3 mmHg LA Vol (A2C):   56.5 ml 22.90 ml/m RA Area:     18.20 cm LA Vol (A4C):   57.5 ml 23.31 ml/m RA Volume:   45.00 ml  18.24 ml/m LA Biplane Vol: 61.5 ml 24.93 ml/m AORTIC VALVE LVOT Vmax:   107.00 cm/s LVOT Vmean:  70.200 cm/s LVOT VTI:    0.222 m  AORTA Ao Root diam: 3.60 cm Ao Asc diam:  4.00 cm  MITRAL VALVE MV Area (PHT): 3.03 cm MV PHT:        72.50 msec MV Decel Time: 250 msec MV E velocity: 76.95 cm/s MV A velocity: 93.80 cm/s MV E/A ratio:  0.82   Charlton Haws MD Electronically signed by Charlton Haws MD Signature Date/Time: 09/10/2018/1:52:41 PM    Final    MONITORS  CARDIAC EVENT MONITOR 10/27/2018  Narrative  Basic underlying rhythm is normal sinus rhythm.  No significant arrhythmias are noted.  No complaints identified.  Heart rate range during. Of monitoring is 42 to 119 bpm.   CT SCANS  CT CORONARY MORPH W/CTA COR W/SCORE 01/13/2020  Addendum 01/13/2020  6:52 PM ADDENDUM REPORT: 01/13/2020 18:50  CLINICAL DATA:  53M with CAD, hypertension, hyperlipidemia, AVNRT and chest pain.  EXAM: Cardiac/Coronary  CT  TECHNIQUE: The patient was scanned on a [employer].  FINDINGS: A 120 kV prospective scan was  triggered in the descending thoracic aorta  at 111 HU's. Axial non-contrast 3 mm slices were carried out through the heart. The data set was analyzed on a dedicated work station and scored using the Agatson method. Gantry rotation speed was 250 msecs and collimation was .6 mm. No beta blockade and 0.8 mg of sl NTG was given. The 3D data set was reconstructed in 5% intervals of the 67-82 % of the R-R cycle. Diastolic phases were analyzed on a dedicated work station using MPR, MIP and VRT modes. The patient received 80 cc of contrast.  Aorta: Ascending aorta mildly dilated. 4.0 cm. Minimal calcification of the descending aorta. No dissection.  Aortic Valve:  Trileaflet.  Mild calcification.  Coronary Arteries:  Normal coronary origin.  Right dominance.  RCA is a large dominant artery that gives rise to PDA and 3 PLV branches. There is diffuse, minimal (<25%) calcified plaque proximally. The RCA has diffuse minimal to mild calcified plaque. The disal RCA has diffuse, minimal disease.  Left main is a large artery that gives rise to LAD and LCX arteries. There is diffuse calcification that is minimally (<25%) obstructive.  LAD is a large vessel. There is minimal calcification ostially. At the level of D1 there is moderate (50-69%) mixed plaque with positive remodeling. The proximal to mid LAD is diffusely diseased with mixed plaque. There are focal regions mid way between D1 and D 2 and just distal to D 2 that are at least moderately (50-69%) stenosed. Cannot rule out severe obstruction. D 1 is an average size vessel with moderate (50-69%) calcified plaque at the ostium. D 2 is large and without stenosis.  CX is a non-dominant artery that has diffuse, minimal (<25%) mostly soft attenuation plaque. OM 1 is approximately 2 mm and cannot rule out obstructive calcified plaque in the proximal vessel. OM 2 is a large vessel without disease.  Other findings:  Normal pulmonary vein  drainage into the left atrium.  Normal let atrial appendage without a thrombus.  Normal size of the pulmonary artery.  IMPRESSION: 1. Coronary calcium score of 1960. This was 92nd percentile for age and sex matched control.  2. Normal coronary origin with right dominance.  3. There are diffuse calcifications in all three coronary distributions.  4.  There is at least moderate (50-69%) stenosis in the LAD and D 1.  5.  Mild calcification of the aortic valve and descending aorta.  6.  Will send study for FRC.  7. Recommend aggressive risk factor modification, including LDL reduction to <70.  Chilton Si, MD   Electronically Signed By: Chilton Si On: 01/13/2020 18:50  Narrative EXAM: OVER-READ INTERPRETATION  CT CHEST  The following report is an over-read performed by radiologist Dr. Trudie Reed of [facility] on 01/13/2020. This over-read does not include interpretation of cardiac or coronary anatomy or pathology. The coronary calcium score/coronary CTA interpretation by the cardiologist is attached.  COMPARISON:  None.  FINDINGS: Aortic atherosclerosis. Within the visualized portions of the thorax there are no suspicious appearing pulmonary nodules or masses, there is no acute consolidative airspace disease, no pleural effusions, no pneumothorax and no lymphadenopathy. Visualized portions of the upper abdomen are unremarkable. There are no aggressive appearing lytic or blastic lesions noted in the visualized portions of the skeleton.  IMPRESSION: 1.  Aortic Atherosclerosis (ICD10-I70.0).  Electronically Signed: By: Trudie Reed M.D. On: 01/13/2020 12:52           Recent Labs: 12/11/2021: BUN 15; Creatinine, Ser 0.99; Potassium 3.9; Sodium 142  Recent Lipid Panel No results found for: "CHOL", "TRIG", "HDL", "CHOLHDL", "VLDL", "LDLCALC", "LDLDIRECT"  Physical Exam:    VS:  BP (!) 140/78   Pulse 68   Ht 6' 2.5" (1.892  m)   Wt 271 lb (122.9 kg)   SpO2 98%   BMI 34.33 kg/m     Wt Readings from Last 3 Encounters:  12/06/22 271 lb (122.9 kg)  11/14/21 263 lb 12.8 oz (119.7 kg)  02/11/20 (!) 261 lb 12.8 oz (118.8 kg)    GEN: Overweight. No acute distress HEENT: Normal NECK: No JVD. CARDIAC: No murmur. RRR no gallop, or edema. VASCULAR:  Normal Pulses.  RESPIRATORY:  Clear to auscultation without rales, wheezing or rhonchi  ABDOMEN: Soft, non-tender, non-distended MUSCULOSKELETAL: No deformity  SKIN: Warm and dry NEUROLOGIC:  Alert and oriented x 3 PSYCHIATRIC:  Normal affect   ASSESSMENT:    1. Essential hypertension   2. SVT (supraventricular tachycardia)   3. Mixed hyperlipidemia   4. CAD in native artery   5. Aortic atherosclerosis (HCC)   6. Myalgia due to statin     PLAN:    Resistant HTN - Amb BP above goal - on HCTZ 25 mg -  on losartan 100 mg-> irbesartan 300 mg BMP in two weeks  Moderate non obstructive CAD Aortic atherosclerosis HLD - stable on current therapy (ARB and CCB) - continue ASA - No nitro needed - LDL goal < 55; - we dicussed dietary changes and exercise (fig bars, one cocktail daily, french fries) - 230 lbs is his goal weight - He has a good exercise routine he will move to 3 days a week wit this in May and 5 days in June - we did exercise planning using a Mediterranean diet; set goal of 2000-2300 calories pen - no change in neuropathic symptoms on rosuvastatin 20 mg; would change to 40 mg and keep zetia  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  Concerns regarding medicines are outlined above.  Orders Placed This Encounter  Procedures   Basic Metabolic Panel (BMET)   EKG 12-Lead   Meds ordered this encounter  Medications   irbesartan (AVAPRO) 300 MG tablet    Sig: Take 1  tablet (300 mg total) by mouth daily.    Dispense:  90 tablet    Refill:  3    Stop losartan      Signed, Christell Constant, MD  12/06/2022 10:29 AM    [facility]

## 2022-12-06 ENCOUNTER — Ambulatory Visit: Payer: Medicare HMO | Attending: Internal Medicine | Admitting: Internal Medicine

## 2022-12-06 ENCOUNTER — Encounter: Payer: Self-pay | Admitting: Internal Medicine

## 2022-12-06 DIAGNOSIS — M791 Myalgia, unspecified site: Secondary | ICD-10-CM | POA: Diagnosis not present

## 2022-12-06 DIAGNOSIS — E782 Mixed hyperlipidemia: Secondary | ICD-10-CM

## 2022-12-06 DIAGNOSIS — I471 Supraventricular tachycardia, unspecified: Secondary | ICD-10-CM

## 2022-12-06 DIAGNOSIS — I10 Essential (primary) hypertension: Secondary | ICD-10-CM | POA: Diagnosis not present

## 2022-12-06 DIAGNOSIS — I2510 Atherosclerotic heart disease of native coronary artery without angina pectoris: Secondary | ICD-10-CM | POA: Insufficient documentation

## 2022-12-06 DIAGNOSIS — T466X5A Adverse effect of antihyperlipidemic and antiarteriosclerotic drugs, initial encounter: Secondary | ICD-10-CM

## 2022-12-06 DIAGNOSIS — I700 Atherosclerosis of aorta: Secondary | ICD-10-CM | POA: Diagnosis not present

## 2022-12-06 MED ORDER — IRBESARTAN 300 MG PO TABS: 300.0000 mg | ORAL_TABLET | Freq: Every day | ORAL | 3 refills | Status: AC

## 2022-12-06 NOTE — Patient Instructions (Addendum)
Medication Instructions:  Your physician has recommended you make the following change in your medication:  1.) stop losartan 2.) start irbesartan 300 mg - take one tablet daily  *If you need a refill on your cardiac medications before your next appointment, please call your pharmacy*   Lab Work: Please return in 2 weeks for blood work Designer, jewellery)  If you have labs (blood work) drawn today and your tests are completely normal, you will receive your results only by: [employer] (if you have MyChart) OR A paper copy in the mail If you have any lab test that is abnormal or we need to change your treatment, we will call you to review the results.   Testing/Procedures: none   Follow-Up: At [facility], you and your health needs are our priority.  As part of our continuing mission to provide you with exceptional heart care, we have created designated Provider Care Teams.  These Care Teams include your primary Cardiologist (physician) and Advanced Practice Providers (APPs -  Physician Assistants and Nurse Practitioners) who all work together to provide you with the care you need, when you need it.   Your next appointment:   12 month(s)  Provider:   Riley Lam, MD or Advanced Practice Provider (NP or PA-C)

## 2022-12-14 DIAGNOSIS — Z008 Encounter for other general examination: Secondary | ICD-10-CM | POA: Diagnosis not present

## 2022-12-14 DIAGNOSIS — E785 Hyperlipidemia, unspecified: Secondary | ICD-10-CM | POA: Diagnosis not present

## 2022-12-14 DIAGNOSIS — I4891 Unspecified atrial fibrillation: Secondary | ICD-10-CM | POA: Diagnosis not present

## 2022-12-14 DIAGNOSIS — I129 Hypertensive chronic kidney disease with stage 1 through stage 4 chronic kidney disease, or unspecified chronic kidney disease: Secondary | ICD-10-CM | POA: Diagnosis not present

## 2022-12-14 DIAGNOSIS — K219 Gastro-esophageal reflux disease without esophagitis: Secondary | ICD-10-CM | POA: Diagnosis not present

## 2022-12-14 DIAGNOSIS — N3941 Urge incontinence: Secondary | ICD-10-CM | POA: Diagnosis not present

## 2022-12-14 DIAGNOSIS — D6869 Other thrombophilia: Secondary | ICD-10-CM | POA: Diagnosis not present

## 2022-12-14 DIAGNOSIS — E669 Obesity, unspecified: Secondary | ICD-10-CM | POA: Diagnosis not present

## 2022-12-14 DIAGNOSIS — Z8249 Family history of ischemic heart disease and other diseases of the circulatory system: Secondary | ICD-10-CM | POA: Diagnosis not present

## 2022-12-14 DIAGNOSIS — E039 Hypothyroidism, unspecified: Secondary | ICD-10-CM | POA: Diagnosis not present

## 2022-12-14 DIAGNOSIS — J4489 Other specified chronic obstructive pulmonary disease: Secondary | ICD-10-CM | POA: Diagnosis not present

## 2022-12-14 DIAGNOSIS — N182 Chronic kidney disease, stage 2 (mild): Secondary | ICD-10-CM | POA: Diagnosis not present

## 2022-12-14 DIAGNOSIS — M1990 Unspecified osteoarthritis, unspecified site: Secondary | ICD-10-CM | POA: Diagnosis not present

## 2022-12-21 ENCOUNTER — Ambulatory Visit: Payer: Medicare HMO | Attending: Internal Medicine

## 2022-12-21 DIAGNOSIS — I10 Essential (primary) hypertension: Secondary | ICD-10-CM

## 2022-12-22 LAB — BASIC METABOLIC PANEL
BUN/Creatinine Ratio: 17 (ref 10–24)
BUN: 21 mg/dL (ref 8–27)
CO2: 26 mmol/L (ref 20–29)
Calcium: 9.3 mg/dL (ref 8.6–10.2)
Chloride: 101 mmol/L (ref 96–106)
Creatinine, Ser: 1.22 mg/dL (ref 0.76–1.27)
Glucose: 99 mg/dL (ref 70–99)
Potassium: 4.4 mmol/L (ref 3.5–5.2)
Sodium: 141 mmol/L (ref 134–144)
eGFR: 62 mL/min/{1.73_m2} (ref >59)

## 2022-12-25 DIAGNOSIS — Z08 Encounter for follow-up examination after completed treatment for malignant neoplasm: Secondary | ICD-10-CM | POA: Diagnosis not present

## 2022-12-25 DIAGNOSIS — Z85828 Personal history of other malignant neoplasm of skin: Secondary | ICD-10-CM | POA: Diagnosis not present

## 2022-12-25 DIAGNOSIS — L570 Actinic keratosis: Secondary | ICD-10-CM | POA: Diagnosis not present

## 2022-12-26 DIAGNOSIS — H25813 Combined forms of age-related cataract, bilateral: Secondary | ICD-10-CM | POA: Diagnosis not present

## 2022-12-26 DIAGNOSIS — H43811 Vitreous degeneration, right eye: Secondary | ICD-10-CM | POA: Diagnosis not present

## 2023-04-12 DIAGNOSIS — R252 Cramp and spasm: Secondary | ICD-10-CM | POA: Diagnosis not present

## 2023-04-12 DIAGNOSIS — E790 Hyperuricemia without signs of inflammatory arthritis and tophaceous disease: Secondary | ICD-10-CM | POA: Diagnosis not present

## 2023-04-12 DIAGNOSIS — E7800 Pure hypercholesterolemia, unspecified: Secondary | ICD-10-CM | POA: Diagnosis not present

## 2023-08-05 DIAGNOSIS — B356 Tinea cruris: Secondary | ICD-10-CM | POA: Diagnosis not present

## 2023-09-06 DIAGNOSIS — L820 Inflamed seborrheic keratosis: Secondary | ICD-10-CM | POA: Diagnosis not present

## 2023-09-06 DIAGNOSIS — C44329 Squamous cell carcinoma of skin of other parts of face: Secondary | ICD-10-CM | POA: Diagnosis not present

## 2023-09-06 DIAGNOSIS — L570 Actinic keratosis: Secondary | ICD-10-CM | POA: Diagnosis not present

## 2023-09-06 DIAGNOSIS — X32XXXD Exposure to sunlight, subsequent encounter: Secondary | ICD-10-CM | POA: Diagnosis not present

## 2023-09-06 DIAGNOSIS — L821 Other seborrheic keratosis: Secondary | ICD-10-CM | POA: Diagnosis not present

## 2023-09-13 DIAGNOSIS — Z87891 Personal history of nicotine dependence: Secondary | ICD-10-CM | POA: Diagnosis not present

## 2023-09-13 DIAGNOSIS — Z136 Encounter for screening for cardiovascular disorders: Secondary | ICD-10-CM | POA: Diagnosis not present

## 2023-09-26 DIAGNOSIS — C44329 Squamous cell carcinoma of skin of other parts of face: Secondary | ICD-10-CM | POA: Diagnosis not present

## 2023-09-30 DIAGNOSIS — C4492 Squamous cell carcinoma of skin, unspecified: Secondary | ICD-10-CM | POA: Diagnosis not present

## 2023-09-30 DIAGNOSIS — J4520 Mild intermittent asthma, uncomplicated: Secondary | ICD-10-CM | POA: Diagnosis not present

## 2023-09-30 DIAGNOSIS — R590 Localized enlarged lymph nodes: Secondary | ICD-10-CM | POA: Diagnosis not present

## 2023-10-04 DIAGNOSIS — H52203 Unspecified astigmatism, bilateral: Secondary | ICD-10-CM | POA: Diagnosis not present

## 2023-10-04 DIAGNOSIS — H2513 Age-related nuclear cataract, bilateral: Secondary | ICD-10-CM | POA: Diagnosis not present

## 2023-10-04 DIAGNOSIS — H02403 Unspecified ptosis of bilateral eyelids: Secondary | ICD-10-CM | POA: Diagnosis not present

## 2023-10-07 ENCOUNTER — Other Ambulatory Visit: Payer: Self-pay | Admitting: Family Medicine

## 2023-10-07 DIAGNOSIS — R590 Localized enlarged lymph nodes: Secondary | ICD-10-CM

## 2023-10-11 DIAGNOSIS — E039 Hypothyroidism, unspecified: Secondary | ICD-10-CM | POA: Diagnosis not present

## 2023-10-11 DIAGNOSIS — E790 Hyperuricemia without signs of inflammatory arthritis and tophaceous disease: Secondary | ICD-10-CM | POA: Diagnosis not present

## 2023-10-11 DIAGNOSIS — E7800 Pure hypercholesterolemia, unspecified: Secondary | ICD-10-CM | POA: Diagnosis not present

## 2023-10-14 DIAGNOSIS — C44329 Squamous cell carcinoma of skin of other parts of face: Secondary | ICD-10-CM | POA: Diagnosis not present

## 2023-10-14 DIAGNOSIS — D485 Neoplasm of uncertain behavior of skin: Secondary | ICD-10-CM | POA: Diagnosis not present

## 2023-10-18 DIAGNOSIS — L570 Actinic keratosis: Secondary | ICD-10-CM | POA: Diagnosis not present

## 2023-10-18 DIAGNOSIS — L578 Other skin changes due to chronic exposure to nonionizing radiation: Secondary | ICD-10-CM | POA: Diagnosis not present

## 2023-10-18 DIAGNOSIS — X32XXXD Exposure to sunlight, subsequent encounter: Secondary | ICD-10-CM | POA: Diagnosis not present

## 2023-10-30 DIAGNOSIS — C44329 Squamous cell carcinoma of skin of other parts of face: Secondary | ICD-10-CM | POA: Diagnosis not present

## 2023-11-06 ENCOUNTER — Ambulatory Visit
Admission: RE | Admit: 2023-11-06 | Discharge: 2023-11-06 | Disposition: A | Discharge status: Home / Self Care | Source: Ambulatory Visit | Attending: Family Medicine | Admitting: Family Medicine

## 2023-11-06 DIAGNOSIS — R590 Localized enlarged lymph nodes: Secondary | ICD-10-CM

## 2023-11-06 DIAGNOSIS — R599 Enlarged lymph nodes, unspecified: Secondary | ICD-10-CM | POA: Diagnosis not present

## 2023-11-06 MED ORDER — IOPAMIDOL (ISOVUE-300) INJECTION 61%
75.0000 mL | Freq: Once | INTRAVENOUS | Status: AC | PRN
  Administered 2023-11-06: 75 mL via INTRAVENOUS

## 2023-11-13 DIAGNOSIS — C44329 Squamous cell carcinoma of skin of other parts of face: Secondary | ICD-10-CM | POA: Diagnosis not present

## 2024-01-07 DIAGNOSIS — H2513 Age-related nuclear cataract, bilateral: Secondary | ICD-10-CM | POA: Diagnosis not present

## 2024-01-13 DIAGNOSIS — Z6835 Body mass index (BMI) 35.0-35.9, adult: Secondary | ICD-10-CM | POA: Diagnosis not present

## 2024-01-13 DIAGNOSIS — J019 Acute sinusitis, unspecified: Secondary | ICD-10-CM | POA: Diagnosis not present

## 2024-01-13 DIAGNOSIS — J4520 Mild intermittent asthma, uncomplicated: Secondary | ICD-10-CM | POA: Diagnosis not present

## 2024-01-13 DIAGNOSIS — J309 Allergic rhinitis, unspecified: Secondary | ICD-10-CM | POA: Diagnosis not present

## 2024-01-13 DIAGNOSIS — B9689 Other specified bacterial agents as the cause of diseases classified elsewhere: Secondary | ICD-10-CM | POA: Diagnosis not present

## 2024-01-14 DIAGNOSIS — Z85828 Personal history of other malignant neoplasm of skin: Secondary | ICD-10-CM | POA: Diagnosis not present

## 2024-01-14 DIAGNOSIS — L570 Actinic keratosis: Secondary | ICD-10-CM | POA: Diagnosis not present

## 2024-01-14 DIAGNOSIS — Z08 Encounter for follow-up examination after completed treatment for malignant neoplasm: Secondary | ICD-10-CM | POA: Diagnosis not present

## 2024-01-14 DIAGNOSIS — X32XXXD Exposure to sunlight, subsequent encounter: Secondary | ICD-10-CM | POA: Diagnosis not present

## 2024-02-14 DIAGNOSIS — C44622 Squamous cell carcinoma of skin of right upper limb, including shoulder: Secondary | ICD-10-CM | POA: Diagnosis not present

## 2024-02-14 DIAGNOSIS — L570 Actinic keratosis: Secondary | ICD-10-CM | POA: Diagnosis not present

## 2024-02-14 DIAGNOSIS — X32XXXD Exposure to sunlight, subsequent encounter: Secondary | ICD-10-CM | POA: Diagnosis not present

## 2024-02-21 DIAGNOSIS — H2513 Age-related nuclear cataract, bilateral: Secondary | ICD-10-CM | POA: Diagnosis not present

## 2024-02-21 DIAGNOSIS — H04123 Dry eye syndrome of bilateral lacrimal glands: Secondary | ICD-10-CM | POA: Diagnosis not present

## 2024-03-13 DIAGNOSIS — H2513 Age-related nuclear cataract, bilateral: Secondary | ICD-10-CM | POA: Diagnosis not present

## 2024-03-27 DIAGNOSIS — E7800 Pure hypercholesterolemia, unspecified: Secondary | ICD-10-CM | POA: Diagnosis not present

## 2024-03-27 DIAGNOSIS — E790 Hyperuricemia without signs of inflammatory arthritis and tophaceous disease: Secondary | ICD-10-CM | POA: Diagnosis not present

## 2024-03-27 DIAGNOSIS — I10 Essential (primary) hypertension: Secondary | ICD-10-CM | POA: Diagnosis not present

## 2024-03-27 DIAGNOSIS — E039 Hypothyroidism, unspecified: Secondary | ICD-10-CM | POA: Diagnosis not present

## 2024-04-01 DIAGNOSIS — H25811 Combined forms of age-related cataract, right eye: Secondary | ICD-10-CM | POA: Diagnosis not present

## 2024-04-01 DIAGNOSIS — H2511 Age-related nuclear cataract, right eye: Secondary | ICD-10-CM | POA: Diagnosis not present

## 2024-05-06 DIAGNOSIS — H2512 Age-related nuclear cataract, left eye: Secondary | ICD-10-CM | POA: Diagnosis not present

## 2024-05-06 DIAGNOSIS — H25812 Combined forms of age-related cataract, left eye: Secondary | ICD-10-CM | POA: Diagnosis not present

## 2024-05-12 DIAGNOSIS — L570 Actinic keratosis: Secondary | ICD-10-CM | POA: Diagnosis not present

## 2024-05-12 DIAGNOSIS — X32XXXD Exposure to sunlight, subsequent encounter: Secondary | ICD-10-CM | POA: Diagnosis not present

## 2024-05-12 DIAGNOSIS — Z85828 Personal history of other malignant neoplasm of skin: Secondary | ICD-10-CM | POA: Diagnosis not present

## 2024-05-12 DIAGNOSIS — Z08 Encounter for follow-up examination after completed treatment for malignant neoplasm: Secondary | ICD-10-CM | POA: Diagnosis not present

## 2024-06-22 ENCOUNTER — Ambulatory Visit: Attending: Internal Medicine | Admitting: Internal Medicine

## 2024-06-22 VITALS — BP 150/90 | HR 71 | Ht 74.0 in | Wt 272.0 lb

## 2024-06-22 DIAGNOSIS — I471 Supraventricular tachycardia, unspecified: Secondary | ICD-10-CM | POA: Diagnosis not present

## 2024-06-22 DIAGNOSIS — R0789 Other chest pain: Secondary | ICD-10-CM | POA: Diagnosis not present

## 2024-06-22 DIAGNOSIS — I700 Atherosclerosis of aorta: Secondary | ICD-10-CM

## 2024-06-22 DIAGNOSIS — I10 Essential (primary) hypertension: Secondary | ICD-10-CM

## 2024-06-22 DIAGNOSIS — E782 Mixed hyperlipidemia: Secondary | ICD-10-CM

## 2024-06-22 DIAGNOSIS — I2510 Atherosclerotic heart disease of native coronary artery without angina pectoris: Secondary | ICD-10-CM | POA: Diagnosis not present

## 2024-06-22 NOTE — Patient Instructions (Signed)
 Medication Instructions:   NO CHANGES  *If you need a refill on your cardiac medications before your next appointment, please call your pharmacy*  Testing/Procedures:  Dr. Santo has ordered a Myocardial Perfusion Imaging Study.   The test will take approximately 3 to 4 hours to complete; you may bring reading material.  If someone comes with you to your appointment, they will need to remain in the main lobby due to limited space in the testing area. **If you are pregnant or breastfeeding, please notify the nuclear lab prior to your appointment**  You will need to hold the following medications prior to your stress test: beta-blockers (24 hours prior to test)   How to prepare for your Myocardial Perfusion Test: Do not eat or drink 3 hours prior to your test, except you may have water. Do not consume products containing caffeine (regular or decaffeinated) 12 hours prior to your test. (ex: coffee, chocolate, sodas, tea). Do wear comfortable clothes (no dresses or overalls) and walking shoes, tennis shoes preferred (No heels or open toe shoes are allowed). Do NOT wear cologne, perfume, aftershave, or lotions (deodorant is allowed). If these instructions are not followed, your test will have to be rescheduled.   Follow-Up: At [facility], you and your health needs are our priority.  As part of our continuing mission to provide you with exceptional heart care, our providers are all part of one team.  This team includes your primary Cardiologist (physician) and Advanced Practice Providers or APPs (Physician Assistants and Nurse Practitioners) who all work together to provide you with the care you need, when you need it.  Your next appointment:    12 months with Dr. Shirlene   We recommend signing up for the patient portal called MyChart.  Sign up information is provided on this After Visit Summary.  MyChart is used to connect with patients for Virtual Visits  (Telemedicine).  Patients are able to view lab/test results, encounter notes, upcoming appointments, etc.  Non-urgent messages can be sent to your provider as well.   To learn more about what you can do with MyChart, go to [email or web address].   Other Instructions  Please monitor your BP at home. We will check in on your home readings when we get in touch about your stress test results.   Check BP once every other day.   HOW TO TAKE YOUR BLOOD PRESSURE: Rest 5 minutes before taking your blood pressure. Don't smoke or drink caffeinated beverages for at least 30 minutes before. Take your blood pressure before (not after) you eat. Sit comfortably with your back supported and both feet on the floor (don't cross your legs). Elevate your arm to heart level on a table or a desk. Use the proper sized cuff. It should fit smoothly and snugly around your bare upper arm. There should be enough room to slip a fingertip under the cuff. The bottom edge of the cuff should be 1 inch above the crease of the elbow. Ideally, take 3 measurements at one sitting and record the average.

## 2024-06-22 NOTE — Progress Notes (Signed)
 Cardiology Office Note:    Date:  06/22/2024   ID:  Jorge Morrison, DOB 1946-01-15, MRN [id number]  PCP:  Cyrena Gwenn SQUIBB, MD (Inactive)  Cardiologist:  Stanly DELENA Leavens, MD   CC: Aortic dilation and resistent hypertenion f/u   History of Present Illness:    Jorge Morrison is a 78 y.o. 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 2023: Seeing Dr. Claudene; allergies to atoravastatin but tolerates rosuvastatin. 2024: on Rosuvastatin 20 mg for myalgias  2025: New [employer] Social: sells shoes two days a week.  Has a [employer] club and [employer] club.  Jorge Morrison is a 78 year old male with coronary artery calcifications and aortic atherosclerosis who presents for evaluation of resistant hypertension and history of AVNRT.  He experiences chest pressure during physical exertion, such as walking up a hill, which resolves with rest and deep breathing. He has not used nitroglycerin  for these episodes. Occasionally, he experiences shortness of breath, particularly after consuming alcohol.  He has a history of coronary artery calcifications and aortic atherosclerosis but has not had obstructive coronary artery disease symptoms in the past. 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 He has experienced first-degree heart block and right bundle branch block on prior EKGs.  He is taking hydrochlorothiazide  for blood pressure management.  He has hyperlipidemia and statin myalgia, which limits his use of statins. His cholesterol levels were slightly above target in September 2025. He consumes alcohol regularly, reporting three beers a day and occasionally a 'healthy drink' of hard liquor.  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 He walks 8,000  to 10,000 steps two days a week but acknowledges not engaging in regular cardiovascular exercise.  Past Medical History:  Diagnosis Date   Chest pain 09/09/2018   Dysrhythmia    treated fr SVT 11/2018, started on medication   Elevated troponin 09/10/2018   GERD (gastroesophageal reflux disease)    Gout 09/10/2018   High cholesterol    HLD (hyperlipidemia) 09/10/2018   Hypertension    Hypothyroidism 09/10/2018   Prostate cancer (HCC)    SVT (supraventricular tachycardia) 09/10/2018   Thyroid  disease     Past Surgical History:  Procedure Laterality Date   APPENDECTOMY     CHOLECYSTECTOMY N/A 02/10/2019   Procedure: LAPAROSCOPIC CHOLECYSTECTOMY;  Surgeon: Rubin Calamity, MD;  Location: [facility] OR;  Service: General;  Laterality: N/A;   PROSTATECTOMY     TONSILLECTOMY      Current Medications: Current Meds  Medication Sig   albuterol (VENTOLIN HFA) 108 (90 Base) MCG/ACT inhaler Inhale 2 puffs into the lungs every 6 (six) hours as needed for wheezing or shortness of breath.   allopurinol  (ZYLOPRIM ) 300 MG tablet Take 300 mg by mouth every evening.    alprostadil (EDEX) 10 MCG injection 10 mcg by Intracavitary route as needed for erectile dysfunction.    aspirin EC 81 MG tablet Take 81 mg by mouth daily. Swallow whole.   cetirizine (ZYRTEC) 10 MG tablet Take 10 mg by mouth daily as needed for allergies.   colchicine 0.6 MG tablet Take 0.6 mg by mouth daily as needed (for gout flare up).    diltiazem  (CARTIA  XT) 240 MG 24 hr capsule Take 1 capsule (240 mg total) by mouth daily.  EPINEPHrine 0.3 mg/0.3 mL IJ SOAJ injection Inject 0.3 mg into the muscle once as needed for anaphylaxis.    ezetimibe (ZETIA) 10 MG tablet Take 10 mg by mouth daily.   fluticasone (FLONASE) 50 MCG/ACT nasal spray Place 1 spray into both nostrils 2 (two) times daily as needed for allergies or rhinitis.   GLUCOSAMINE-CHONDROITIN-MSM PO Take 1 tablet by mouth daily.    hydrochlorothiazide  (HYDRODIURIL ) 25 MG tablet  Take 1 tablet (25 mg total) by mouth daily.   irbesartan  (AVAPRO ) 300 MG tablet Take 1 tablet (300 mg total) by mouth daily.   levothyroxine  (SYNTHROID ) 200 MCG tablet Take 200 mcg by mouth every morning.   nitroGLYCERIN  (NITROSTAT ) 0.4 MG SL tablet Place 1 tablet (0.4 mg total) under the tongue every 5 (five) minutes as needed for chest pain.   Omega-3 Krill Oil 500 MG CAPS Take 500 mg by mouth every evening.    omeprazole (PRILOSEC) 20 MG capsule Take 20 mg by mouth daily.   Polyethyl Glycol-Propyl Glycol (SYSTANE ULTRA) 0.4-0.3 % SOLN Place 1 drop into both eyes daily as needed (for dry eyes).   Probiotic Product (PROBIOTIC PO) Take 1 capsule by mouth daily.   rosuvastatin (CRESTOR) 40 MG tablet Take 40 mg by mouth daily.   sodium chloride  (OCEAN) 0.65 % nasal spray Place 1 spray into the nose daily as needed for congestion.   Zinc 50 MG CAPS Take 50 mg by mouth daily as needed (for cold).      Allergies:   Other, Gramineae pollens, and Atorvastatin    Social History   Socioeconomic History   Marital status: Married    Spouse name: Not on file   Number of children: Not on file   Years of education: Not on file   Highest education level: Not on file  Occupational History   Not on file  Tobacco Use   Smoking status: Former   Smokeless tobacco: Never  Vaping Use   Vaping status: Never Used  Substance and Sexual Activity   Alcohol use: Yes    Comment: daily   Drug use: No   Sexual activity: Not on file  Other Topics Concern   Not on file  Social History Narrative   Not on file   Social Drivers of Health   Financial Resource Strain: Low Risk (10/20/2019)   Received from [facility]   Overall Financial Resource Strain (CARDIA)    Difficulty of Paying Living Expenses: Not hard at all  Food Insecurity: No Food Insecurity (10/20/2019)   Received from [facility]   Hunger Vital Sign    Within the past 12 months, you worried that your food would run out before you got  the money to buy more.: Never true    Within the past 12 months, the food you bought just didn't last and you didn't have money to get more.: Never true  Transportation Needs: No Transportation Needs (10/20/2019)   Received from [facility]   PRAPARE - Transportation    Lack of Transportation (Medical): No    Lack of Transportation (Non-Medical): No  Physical Activity: Not on file  Stress: Not on file  Social Connections: Not on file     Family History: The patient's family history includes CAD in his brother and mother.  ROS:   Please see the history of present illness.    He is concerned about his blood pressures.  He brought in a log of blood pressures and generally is running above  140/90.  He drinks alcohol every day.  He does not control sodium intake.  All other systems reviewed and are negative.  EKGs/Labs/Other Studies Reviewed:    The following studies were reviewed today:   Cardiac Studies & Procedures   ______________________________________________________________________________________________   STRESS TESTS  NM MYOCAR MULTI W/SPECT W 09/11/2018  Narrative  Blood pressure demonstrated a hypertensive response to exercise.  There was no ST segment deviation noted during stress.  No T wave inversion was noted during stress.  The study is normal.  This is a low risk study.  The left ventricular ejection fraction is mildly decreased (45-54%).  Diaphragmatic attenuation no ischemia EF estimated 47% but visually looks normal And EF was 60-65% by echo 09/10/18   ECHOCARDIOGRAM  ECHOCARDIOGRAM COMPLETE 09/10/2018  Narrative ECHOCARDIOGRAM REPORT    Patient Name:   Jorge Morrison Date of Exam: 09/10/2018 Medical Rec #:  [id number]          Height:       75.0 in Accession #:    [id number]         Weight:       263.5 lb Date of Birth:  1945/10/03          BSA:          2.47 m Patient Age:    72 years           BP:           116/73 mmHg Patient  Gender: M                  HR:           65 bpm. Exam Location:  Inpatient   Procedure: 2D Echo, Cardiac Doppler and Color Doppler  Indications:    R07.9* Chest pain, unspecified  History:        Patient has no prior history of Echocardiogram examinations. Elevated troponin, Abnormal ECG; Signs/Symptoms: Chest Pain; Risk Factors: Dyslipidemia.  Sonographer:    Ellouise Mose Referring Phys: 75 ARSHAD N KAKRAKANDY  IMPRESSIONS   1. The left ventricle has normal systolic function with an ejection fraction of 60-65%. The cavity size was normal. There is moderately increased left ventricular wall thickness. Left ventricular diastolic parameters were normal. 2. The right ventricle has normal systolic function. The cavity was normal. There is no increase in right ventricular wall thickness. 3. The mitral valve is normal in structure. Mild thickening of the mitral valve leaflet. Mild calcification of the mitral valve leaflet. There is mild mitral annular calcification present. 4. The tricuspid valve is normal in structure. 5. The aortic valve is tricuspid Mild thickening of the aortic valve Mild calcification of the aortic valve. 6. The pulmonic valve was normal in structure. 7. There is mild dilatation of the aortic root measuring 42 mm.  FINDINGS Left Ventricle: The left ventricle has normal systolic function, with an ejection fraction of 60-65%. The cavity size was normal. There is moderately increased left ventricular wall thickness. Left ventricular diastolic parameters were normal Right Ventricle: The right ventricle has normal systolic function. The cavity was normal. There is no increase in right ventricular wall thickness.    Left Atrium: left atrial size was normal in size Right Atrium: right atrial size was normal in size. Right atrial pressure is estimated at 3 mmHg.  Interatrial Septum: No atrial level shunt detected by color flow Doppler. Pericardium: There is no evidence of  pericardial effusion. Mitral Valve: The mitral valve is normal in structure.  Mild thickening of the mitral valve leaflet. Mild calcification of the mitral valve leaflet. There is mild mitral annular calcification present. Mitral valve regurgitation is not visualized by color flow Doppler. Tricuspid Valve: The tricuspid valve is normal in structure. Tricuspid valve regurgitation was not visualized by color flow Doppler. Aortic Valve: The aortic valve is tricuspid Mild thickening of the aortic valve and Mild calcification of the aortic valve Aortic valve regurgitation was not visualized by color flow Doppler. There is no evidence of aortic valve stenosis. Pulmonic Valve: The pulmonic valve was normal in structure. Pulmonic valve regurgitation is not visualized by color flow Doppler. Aorta: There is mild dilatation of the aortic root measuring 42 mm. Venous: The inferior vena cava is normal in size with greater than 50% respiratory variability.  LEFT VENTRICLE PLAX 2D (Teich)              Biplane EF (MOD) LV EF:          51.3 %       LV Biplane EF:   71.5 % LVIDd:          4.60 cm      LV A4C EF:       67.9 % LVIDs:          3.40 cm      LV A2C EF:       75.5 % LV PW:          1.40 cm LV IVS:         1.50 cm      Diastology LVOT diam:      2.10 cm      LV e' lateral:   7.94 cm/s LV SV:          50 ml        LV E/e' lateral: 9.7 LVOT Area:      3.46 cm     LV e' medial:    6.64 cm/s LV E/e' medial:  11.6 LV Volumes (MOD) LV area d, A2C:    30.00 cm LV area d, A4C:    35.10 cm LV area s, A2C:    12.70 cm LV area s, A4C:    18.10 cm LV major d, A2C:   8.97 cm LV major d, A4C:   8.74 cm LV major s, A2C:   7.00 cm LV major s, A4C:   7.46 cm LV vol d, MOD A2C: 82.5 ml LV vol d, MOD A4C: 116.0 ml LV vol s, MOD A2C: 20.2 ml LV vol s, MOD A4C: 37.2 ml LV SV MOD A2C:     62.3 ml LV SV MOD A4C:     116.0 ml LV SV MOD BP:      70.8 ml  RIGHT VENTRICLE RV S prime:     11.40 cm/s TAPSE  (M-mode): 2.6 cm  LEFT ATRIUM             Index       RIGHT ATRIUM           Index LA diam:        3.20 cm 1.30 cm/m  RA Pressure: 3 mmHg LA Vol (A2C):   56.5 ml 22.90 ml/m RA Area:     18.20 cm LA Vol (A4C):   57.5 ml 23.31 ml/m RA Volume:   45.00 ml  18.24 ml/m LA Biplane Vol: 61.5 ml 24.93 ml/m AORTIC VALVE LVOT Vmax:   107.00 cm/s LVOT Vmean:  70.200 cm/s LVOT VTI:  0.222 m  AORTA Ao Root diam: 3.60 cm Ao Asc diam:  4.00 cm  MITRAL VALVE MV Area (PHT): 3.03 cm MV PHT:        72.50 msec MV Decel Time: 250 msec MV E velocity: 76.95 cm/s MV A velocity: 93.80 cm/s MV E/A ratio:  0.82   Maude Emmer MD Electronically signed by Maude Emmer MD Signature Date/Time: 09/10/2018/1:52:41 PM    Final    MONITORS  CARDIAC EVENT MONITOR 09/24/2018  Narrative  Basic underlying rhythm is normal sinus rhythm.  No significant arrhythmias are noted.  No complaints identified.  Heart rate range during. Of monitoring is 42 to 119 bpm.   CT SCANS  CT CORONARY FRACTIONAL FLOW RESERVE DATA PREP 01/13/2020  Narrative EXAM: FFRCT ANALYSIS  FINDINGS: FFRct analysis was performed on the original cardiac CT angiogram dataset. Diagrammatic representation of the FFRct analysis is provided in a separate PDF document in PACS. This dictation was created using the PDF document and an interactive 3D model of the results. 3D model is not available in the EMR/PACS. Normal FFR range is >0.80.  1. Left Main:FFRct 0.99  2. LAD: FFRct 0.95 proximally, 0.83 mid, 0.71 distal to D2. FFRct 0.93 in D1 and 0.83 in D2.  3. LCX: FFRct 0.99 proximal, 0.97 mid  4. RCA: FFRct 0.99 proximal, 0.93 distal  IMPRESSION: 1.  FFRct results are abnormal in the distal LAD.  2. Findings are consistent with significant obstruction in the LAD distal to D2.  3. Recommend aggressive medical management and consideration for cardiac catheterization if clinically indicated.   Electronically  Signed By: Annabella Scarce On: 01/14/2020 14:56   CT CORONARY MORPH W/CTA COR W/SCORE 01/13/2020  Addendum 01/13/2020  6:52 PM ADDENDUM REPORT: 01/13/2020 18:50  CLINICAL DATA:  48M with CAD, hypertension, hyperlipidemia, AVNRT and chest pain.  EXAM: Cardiac/Coronary  CT  TECHNIQUE: The patient was scanned on a [employer].  FINDINGS: A 120 kV prospective scan was triggered in the descending thoracic aorta at 111 HU's. Axial non-contrast 3 mm slices were carried out through the heart. The data set was analyzed on a dedicated work station and scored using the Agatson method. Gantry rotation speed was 250 msecs and collimation was .6 mm. No beta blockade and 0.8 mg of sl NTG was given. The 3D data set was reconstructed in 5% intervals of the 67-82 % of the R-R cycle. Diastolic phases were analyzed on a dedicated work station using MPR, MIP and VRT modes. The patient received 80 cc of contrast.  Aorta: Ascending aorta mildly dilated. 4.0 cm. Minimal calcification of the descending aorta. No dissection.  Aortic Valve:  Trileaflet.  Mild calcification.  Coronary Arteries:  Normal coronary origin.  Right dominance.  RCA is a large dominant artery that gives rise to PDA and 3 PLV branches. There is diffuse, minimal (<25%) calcified plaque proximally. The RCA has diffuse minimal to mild calcified plaque. The disal RCA has diffuse, minimal disease.  Left main is a large artery that gives rise to LAD and LCX arteries. There is diffuse calcification that is minimally (<25%) obstructive.  LAD is a large vessel. There is minimal calcification ostially. At the level of D1 there is moderate (50-69%) mixed plaque with positive remodeling. The proximal to mid LAD is diffusely diseased with mixed plaque. There are focal regions mid way between D1 and D 2 and just distal to D 2 that are at least moderately (50-69%) stenosed. Cannot rule out severe obstruction. D 1  is an  average size vessel with moderate (50-69%) calcified plaque at the ostium. D 2 is large and without stenosis.  CX is a non-dominant artery that has diffuse, minimal (<25%) mostly soft attenuation plaque. OM 1 is approximately 2 mm and cannot rule out obstructive calcified plaque in the proximal vessel. OM 2 is a large vessel without disease.  Other findings:  Normal pulmonary vein drainage into the left atrium.  Normal let atrial appendage without a thrombus.  Normal size of the pulmonary artery.  IMPRESSION: 1. Coronary calcium  score of 1960. This was 92nd percentile for age and sex matched control.  2. Normal coronary origin with right dominance.  3. There are diffuse calcifications in all three coronary distributions.  4.  There is at least moderate (50-69%) stenosis in the LAD and D 1.  5.  Mild calcification of the aortic valve and descending aorta.  6.  Will send study for FRC.  7. Recommend aggressive risk factor modification, including LDL reduction to <70.  Annabella Scarce, MD   Electronically Signed By: Annabella Scarce On: 01/13/2020 18:50  Narrative EXAM: OVER-READ INTERPRETATION  CT CHEST  The following report is an over-read performed by radiologist Dr. Toribio Aye of [facility] on 01/13/2020. This over-read does not include interpretation of cardiac or coronary anatomy or pathology. The coronary calcium  score/coronary CTA interpretation by the cardiologist is attached.  COMPARISON:  None.  FINDINGS: Aortic atherosclerosis. Within the visualized portions of the thorax there are no suspicious appearing pulmonary nodules or masses, there is no acute consolidative airspace disease, no pleural effusions, no pneumothorax and no lymphadenopathy. Visualized portions of the upper abdomen are unremarkable. There are no aggressive appearing lytic or blastic lesions noted in the visualized portions of the skeleton.  IMPRESSION: 1.   Aortic Atherosclerosis (ICD10-I70.0).  Electronically Signed: By: Toribio Aye M.D. On: 01/13/2020 12:52     ______________________________________________________________________________________________        Physical Exam:    VS:  BP (!) 150/90 (BP Location: Left Arm)   Pulse 71   Ht 6' 2 (1.88 m)   Wt 272 lb (123.4 kg)   SpO2 96%   BMI 34.92 kg/m     Wt Readings from Last 3 Encounters:  06/22/24 272 lb (123.4 kg)  12/06/22 271 lb (122.9 kg)  11/14/21 263 lb 12.8 oz (119.7 kg)    GEN: No acute distress CARDIAC: No murmur. RRR no gallop, or edema. VASCULAR:  Normal Pulses.  RESPIRATORY:  Clear to auscultation without rales, wheezing or rhonchi  ABDOMEN: Soft, non-tender, non-distended MUSCULOSKELETAL: No deformity  SKIN: Warm and dry NEUROLOGIC:  Alert and oriented x 3 PSYCHIATRIC:  Normal affect   ASSESSMENT/PLAN:    Coronary artery calcifications without obstructive coronary artery disease with exertional chest pressure Coronary artery calcifications present without evidence of obstructive coronary artery disease. Exertional chest pressure noted during activities such as walking uphill and retrieving a Christmas tree. No use of nitroglycerin  reported. Stress test planned to evaluate for significant perfusion defects or changes, which may necessitate medication adjustment or heart catheterization. CT attenuation during stress test will also assess aortic dilation. - Ordered stress test with CT attenuation to evaluate for perfusion defects and assess aortic dilation.  Resistant hypertension Blood pressure elevated at 150/90 mmHg. Discussed lifestyle modifications including reducing alcohol intake and increasing exercise. Consideration of medication adjustment from hydrochlorothiazide  to chlorthalidone if blood pressure remains elevated after lifestyle changes. - Recheck ambulatory blood pressures. - If blood pressure remains elevated, will  switch from  hydrochlorothiazide  to chlorthalidone.  Aortic atherosclerosis with mild aortic dilation Mild aortic dilation noted, considered within normal limits when indexed to age and body surface area. No immediate intervention required unless significant changes are noted. - Will monitor aortic dilation with CT attenuation during stress test; echo in 2026 unless TAA   Mixed hyperlipidemia Cholesterol levels slightly above target as of September 2025. Discussed potential improvement with lifestyle changes including increased exercise and reduced alcohol consumption. 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 Heart rate occasionally on the slower side, indicating first degree heart block or right bundle branch block, but not requiring intervention. - Continue diltiazem  for rate control.  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 No significant symptoms or need for pacemaker at this time.  Stanly Leavens, MD FASE [facility] Cardiologist [facility]  [address] [place], [place] [address] [phone]  12:17 PM

## 2024-07-10 DIAGNOSIS — H04123 Dry eye syndrome of bilateral lacrimal glands: Secondary | ICD-10-CM | POA: Diagnosis not present

## 2024-07-10 DIAGNOSIS — Z961 Presence of intraocular lens: Secondary | ICD-10-CM | POA: Diagnosis not present

## 2024-07-22 ENCOUNTER — Other Ambulatory Visit: Payer: Self-pay | Admitting: Internal Medicine

## 2024-07-22 DIAGNOSIS — R0789 Other chest pain: Secondary | ICD-10-CM

## 2024-07-22 DIAGNOSIS — I2510 Atherosclerotic heart disease of native coronary artery without angina pectoris: Secondary | ICD-10-CM

## 2024-07-24 ENCOUNTER — Telehealth (HOSPITAL_COMMUNITY): Payer: Self-pay | Admitting: *Deleted

## 2024-07-24 NOTE — Telephone Encounter (Signed)
 Left message on voicemail per DPR in reference to upcoming appointment scheduled on 07/30/2024 at 8:00 with detailed instructions given per Myocardial Perfusion Study Information Sheet for the test. LM to arrive 15 minutes early, and that it is imperative to arrive on time for appointment to keep from having the test rescheduled. If you need to cancel or reschedule your appointment, please call the office within 24 hours of your appointment. Failure to do so may result in a cancellation of your appointment, and a $50 no show fee. Phone number given for call back for any questions.

## 2024-07-30 ENCOUNTER — Ambulatory Visit (HOSPITAL_COMMUNITY)
Admission: RE | Admit: 2024-07-30 | Discharge: 2024-07-30 | Disposition: A | Discharge status: Home / Self Care | Source: Ambulatory Visit | Attending: Cardiovascular Disease | Admitting: Cardiovascular Disease

## 2024-07-30 DIAGNOSIS — I2510 Atherosclerotic heart disease of native coronary artery without angina pectoris: Secondary | ICD-10-CM | POA: Diagnosis present

## 2024-07-30 DIAGNOSIS — R0789 Other chest pain: Secondary | ICD-10-CM | POA: Insufficient documentation

## 2024-07-30 LAB — MYOCARDIAL PERFUSION IMAGING
Angina Index: 0
Duke Treadmill Score: 6
Estimated workload: 6.2
Exercise duration (min): 5 min
Exercise duration (sec): 30 s
LV dias vol: 133 mL (ref 62–150)
LV sys vol: 47 mL
MPHR: 144 {beats}/min
Nuc Stress EF: 65 %
Peak HR: 130 {beats}/min
Percent HR: 90 %
RPE: 18
Rest HR: 65 {beats}/min
Rest Nuclear Isotope Dose: 13.1 mCi
SDS: 0
SRS: 2
SSS: 2
ST Depression (mm): 0 mm
Stress Nuclear Isotope Dose: 38.7 mCi
TID: 1.03

## 2024-07-30 MED ORDER — TECHNETIUM TC 99M TETROFOSMIN IV KIT
13.1000 | PACK | Freq: Once | INTRAVENOUS | Status: AC | PRN
  Administered 2024-07-30: 13.1 via INTRAVENOUS

## 2024-07-30 MED ORDER — TECHNETIUM TC 99M TETROFOSMIN IV KIT
38.7000 | PACK | Freq: Once | INTRAVENOUS | Status: AC | PRN
  Administered 2024-07-30: 38.7 via INTRAVENOUS

## 2024-07-31 ENCOUNTER — Ambulatory Visit: Payer: Self-pay | Admitting: Internal Medicine

## 2024-07-31 ENCOUNTER — Encounter: Payer: Self-pay | Admitting: Internal Medicine

## 2024-07-31 DIAGNOSIS — I77810 Thoracic aortic ectasia: Secondary | ICD-10-CM

## 2024-07-31 DIAGNOSIS — I700 Atherosclerosis of aorta: Secondary | ICD-10-CM
